# Patient Record
Sex: Male | Born: 1950 | Race: White | Hispanic: No | Marital: Single | Smoking: Never smoker
Health system: Southern US, Community
[De-identification: ages and names within clinical notes are randomized; demographics above are authoritative.]

## PROBLEM LIST (undated history)

## (undated) DIAGNOSIS — K219 Gastro-esophageal reflux disease without esophagitis: Secondary | ICD-10-CM

## (undated) DIAGNOSIS — M1990 Unspecified osteoarthritis, unspecified site: Secondary | ICD-10-CM

## (undated) DIAGNOSIS — K5900 Constipation, unspecified: Secondary | ICD-10-CM

## (undated) DIAGNOSIS — Z5189 Encounter for other specified aftercare: Secondary | ICD-10-CM

## (undated) DIAGNOSIS — IMO0002 Reserved for concepts with insufficient information to code with codable children: Secondary | ICD-10-CM

## (undated) DIAGNOSIS — T7840XA Allergy, unspecified, initial encounter: Secondary | ICD-10-CM

## (undated) DIAGNOSIS — J45909 Unspecified asthma, uncomplicated: Secondary | ICD-10-CM

## (undated) DIAGNOSIS — I10 Essential (primary) hypertension: Secondary | ICD-10-CM

## (undated) DIAGNOSIS — Z9109 Other allergy status, other than to drugs and biological substances: Secondary | ICD-10-CM

## (undated) HISTORY — DX: Other allergy status, other than to drugs and biological substances: Z91.09

## (undated) HISTORY — DX: Gastro-esophageal reflux disease without esophagitis: K21.9

## (undated) HISTORY — DX: Unspecified osteoarthritis, unspecified site: M19.90

## (undated) HISTORY — DX: Constipation, unspecified: K59.00

## (undated) HISTORY — DX: Allergy, unspecified, initial encounter: T78.40XA

## (undated) HISTORY — DX: Encounter for other specified aftercare: Z51.89

## (undated) HISTORY — DX: Essential (primary) hypertension: I10

## (undated) HISTORY — DX: Unspecified asthma, uncomplicated: J45.909

## (undated) HISTORY — PX: CATARACT EXTRACTION: SUR2

## (undated) HISTORY — DX: Reserved for concepts with insufficient information to code with codable children: IMO0002

## (undated) HISTORY — PX: POLYPECTOMY: SHX149

## (undated) HISTORY — PX: COLONOSCOPY: SHX174

---

## 1997-11-22 ENCOUNTER — Other Ambulatory Visit: Admission: RE | Admit: 1997-11-22 | Discharge: 1997-11-22 | Payer: Self-pay | Admitting: Dermatology

## 2001-04-19 ENCOUNTER — Ambulatory Visit (HOSPITAL_COMMUNITY): Admission: RE | Admit: 2001-04-19 | Discharge: 2001-04-19 | Payer: Self-pay | Admitting: Gastroenterology

## 2006-01-13 ENCOUNTER — Ambulatory Visit: Payer: Self-pay | Admitting: Internal Medicine

## 2006-02-02 ENCOUNTER — Encounter: Payer: Self-pay | Admitting: Internal Medicine

## 2006-02-02 ENCOUNTER — Ambulatory Visit: Payer: Self-pay | Admitting: Internal Medicine

## 2007-12-29 ENCOUNTER — Ambulatory Visit: Payer: Self-pay | Admitting: Cardiology

## 2008-01-02 ENCOUNTER — Ambulatory Visit: Payer: Self-pay

## 2010-10-27 NOTE — Assessment & Plan Note (Signed)
[facility]                            CARDIOLOGY OFFICE NOTE   NAME:Steven Moreno, Steven Moreno                        MRN:          [id number]  DATE:12/29/2007                            DOB:          04/07/1951    The patient is a very pleasant 60 year old gentleman who I am asked to  evaluate for chest pain.  He has no prior cardiac history.  He typically  does not have exertional chest pain.  He does have some dyspnea on  exertion, but there is no orthopnea, PND, pedal edema, palpitations,  presyncope, or syncope.  Since May, he has been having pain in his left  upper chest area.  It is described as a sharp sensation.  It does not  radiate.  It is not pleuritic or positional nor is it related to food.  It is not exertional.  It lasts for 10-15 minutes and resolve  spontaneously.  Note, there is no associated nausea, vomiting, shortness  of breath, or diaphoresis.  Also of note, he had had a problem with a  cervical disk problem and had pain in his left upper extremity related  to this that was continuous.  He has proceeded with therapy and this is  improving and he feels this pain in his chest may be similar to what was  in his left shoulder.  Because of the above, we were asked to further  evaluate.   His present medications include Benicar HCT 40/25 mg p.o. daily,  potassium 20 mEq p.o. daily, Nexium, and amlodipine 5 mg p.o. daily.  He  uses Astelin as needed, and Advil as needed.  He has a question history  of an allergy to ASPIRIN.   SOCIAL HISTORY:  He do not smoke and only very rarely consumes alcohol.   His family history is negative for coronary artery disease.  He did  state that his mother had congestive heart failure related to  chemotherapy.   His past medical history is significant for hypertension, but there is  no diabetes mellitus or hyperlipidemia.  He does have a history of  peptic ulcer disease.  He has had psoriatic arthritis.  He has  also had  a cervical disk problem as described in the HPI.  He has had no previous  surgeries.   REVIEW OF SYSTEMS:  He denies any headaches, fevers, or chills.  There  is no productive cough or hemoptysis.  There is no dysphagia,  odynophagia, melena, or hematochezia.  There is no dysuria or hematuria.  There is no rashes or seizure activity.  There is no orthopnea, PND, or  pedal edema.  He does have problems with arthritis.  The remaining  systems are negative.   Physical examination today shows a blood pressure of 133/84 and his  pulse is 82.  He weighs 175 pounds.  He is well developed and well  nourished in no acute distress.  Skin is warm and dry.  He does have  some areas of psoriasis.  He does not appear to be depressed.  There is  no peripheral clubbing.  His back is normal.  His HEENT is normal with  normal eyelids.  His neck is supple with a normal upstroke bilaterally.  No bruits noted.  There is no jugular venous distention and I could not  appreciate thyromegaly.  His chest is clear to auscultation, normal  expansion.  His cardiovascular exam reveals regular rhythm.  Normal S1  and S2.  There are no murmurs, rubs, or gallops noted.  Abdominal exam,  no tenderness, nondistended.  Positive bowel sounds.  No  hepatosplenomegaly.  No mass appreciated.  There is no abdominal bruit.  He has 2+ femoral pulses bilaterally.  No bruits.  Extremities show no  edema.  I can palpate no cords.  He has 2+ posterior tibial pulses  bilaterally.  Neurologic exam is grossly intact.   I do have an electrocardiogram from Dr. Landry Dyke office dated December 18, 2007.  At that time, the patient had a sinus rhythm with a normal axis.  There were no significant ST changes noted.   DIAGNOSES:  1. Atypical chest pain - Steven Moreno symptoms may be related to      musculoskeletal pain.  We will plan to proceed with a stress      Myoview.  If it shows no ischemia, then we will not pursue further       ischemia evaluation.  2. Hypertension - his blood pressure appears to be adequately      controlled and he will follow up with Dr. Tresa Endo concerning this      issue.  3. History of peptic ulcer disease.  4. History of psoriatic arthritis.  5. History of hepatic steatosis.  6. History of Graves disease.   We will see him back on an as-needed basis pending the results of his  Myoview.     Madolyn Frieze Jens Som, MD, [facility]  Electronically Signed    BSC/MedQ  DD: 12/29/2007  DT: 12/30/2007  Job #: [id number]   cc:   Pearson Forster, M.D.

## 2010-10-30 NOTE — Procedures (Signed)
[facility]  Patient:    Steven Moreno, Steven Moreno Visit Number: [id number] MRN: [id number]          Service Type: END Location: ENDO Attending Physician:  Orland Mustard Dictated by:   Llana Aliment. Randa Evens, M.D. Proc. Date: 04/19/01 Admit Date:  04/19/2001   CC:         Pearson Forster, M.D.   Procedure Report  PROCEDURE PERFORMED:  Colonoscopy with coagulation of polyp.  ENDOSCOPIST:  Llana Aliment. Randa Evens, M.D.  MEDICATIONS USED:  Fentanyl 7.5 mcg, Versed 7.5 mg IV.  INSTRUMENT:  Adult Olympus video colonoscope.  INDICATIONS:  Patient with a strong family history of colon polyps ____________ .  The patient had a mother with colon polyps.  DESCRIPTION OF PROCEDURE:  The adult Olympus video colonoscope was used.  A digital exam was performed and the Olympus adult video colonoscope was inserted and advanced under direct visualization.  The prep was excellent and we were able to advance to the cecum without difficulty.  The ileocecal valve and appendiceal orifice were seen.  The scope was withdrawn.  The cecum, ascending colon, hepatic flexure, transverse colon, splenic flexure, descending and sigmoid colon were seen well upon withdrawal.  No significant lesions were seen.  A 0.5 cm polyp was seen in the rectum and was cauterized. Scope withdrawn, patient tolerated the procedure well.  ASSESSMENT:  Rectal polyp cauterized.  PLAN:  Check pathology.  Recommend repeating in three or five years.  Will see back in the office in six months if abnormal liver function tests. Dictated by:   Llana Aliment. Randa Evens, M.D. Attending Physician:  Orland Mustard DD:  04/19/01 TD:  04/20/01 Job: [id number]

## 2010-10-30 NOTE — Assessment & Plan Note (Signed)
[facility]                           GASTROENTEROLOGY OFFICE NOTE   NAME:Steven Moreno, Steven Moreno                        MRN:          [id number]  DATE:01/13/2006                            DOB:          Feb 26, 1951    OFFICE CONSULTATION NOTE:   REFERRING PHYSICIAN:  Pearson Forster, MD   REASON FOR CONSULTATION:  Abdominal pain.   HISTORY:  This is a 60 year old white male with a history of psoriatic  arthritis, hypertension, hepatic steatosis, Grave's disease, and remote  peptic ulcer disease complicated by GI bleeding, who was referred through  the courtesy of Dr. Tresa Endo with regard to abdominal pain.  The patient has  been evaluated on this office on two previous occasions for elevated hepatic  transaminases felt secondary to fatty liver disease.  See those dictations  for details.  His last such visit was in August 2005.  His current history  dates back to May, when the patient was started on baby aspirin.  About a  month later he began to develop some mid to lower abdominal discomfort  reminiscent of prior ulcer disease.  He discontinued the medication and was  evaluated by Dr. Tresa Endo on December 31, 2005.  At that time laboratories were  unremarkable, including a hemoglobin of 15.0, normal amylase, normal lipase,  negative Helicobacter pylori antibody, and normal liver function studies.  Hemoccult cards were provided and returned, though the results are unknown.  He was started on Nexium 40 mg daily.  Several days thereafter on Nexium,  off aspirin, his pain resolved.  He denies nausea, vomiting, heartburn,  dysphagia, change in bowel habits.  He also reports to me the presence of  bright red blood in the stool approximately 2 weeks ago.  He did have a  screening colonoscopy in 2000, none since.  There is a family history of  colon cancer.   PAST MEDICAL HISTORY:  As above.   PAST SURGICAL HISTORY:  None.   ALLERGIES:  Intolerant to aspirin and  nonsteroidal anti-inflammatory drugs  (GI bleeding).   CURRENT MEDICATIONS:  1.  Benicar/HCTZ 40/25 mg daily.  2.  Potassium chloride 20 mEq daily.  3.  Nexium 40 mg daily.  4.  Multivitamin p.r.n.   FAMILY HISTORY:  Grandfather with colon cancer.  Father with ulcerative  colitis.   SOCIAL HISTORY:  The patient is single.  He works in Audiological scientist as an  Production designer, theatre/television/film with [employer].  He does not smoke or use alcohol.   PHYSICAL EXAMINATION:  GENERAL:  A well-appearing male in no acute distress.  VITAL SIGNS:  His blood pressure is 130/90, heart rate is 72, weight is 172  pounds.  HEENT:  Sclerae are anicteric.  Conjunctivae are pink.  Oral mucosa intact.  NECK:  No adenopathy.  LUNGS:  Clear.  CARDIAC:  Heart is regular.  ABDOMEN:  Soft without tenderness, mass or hernia.  Good bowel sounds heard.  RECTAL:  Deferred.  EXTREMITIES:  Without edema.   IMPRESSION:  1.  Recent problems with abdominal pain on aspirin, likely due to  nonsteroidal anti-inflammatory drug intolerance or recurrent ulcer      disease.  No evidence of upper gastrointestinal bleeding.  Currently      asymptomatic off aspirin, on Nexium.  2.  Minor rectal bleeding, now resolved.  Rule out benign anorectal      pathology.  Rule out neoplasia.  3.  Family history of colon cancer in his grandfather.  4.  General medical problems as discussed above.   RECOMMENDATIONS:  1.  Schedule colonoscopy to evaluate rectal bleeding and provide neoplasia      screening.  The nature of the procedure as well as the risks, benefits,      and alternatives have been reviewed.  He understood and agreed to      proceed.  2.  Schedule upper endoscopy to evaluate abdominal pain.  3.  Continue Nexium until completion of endoscopy.  Fifteen days of      additional samples have been provided.  4.  Ongoing general medical care with Dr. Tresa Endo.                                   Wilhemina Bonito. Eda Keys., MD   JNP/MedQ  DD:   01/13/2006  DT:  01/13/2006  Job #:  [id number]   cc:   Pearson Forster, MD

## 2011-02-19 ENCOUNTER — Encounter: Payer: Self-pay | Admitting: Internal Medicine

## 2011-03-17 ENCOUNTER — Encounter: Payer: Self-pay | Admitting: Internal Medicine

## 2011-03-26 ENCOUNTER — Encounter: Payer: Self-pay | Admitting: Internal Medicine

## 2011-03-26 ENCOUNTER — Ambulatory Visit (AMBULATORY_SURGERY_CENTER): Payer: BC Managed Care – PPO | Admitting: *Deleted

## 2011-03-26 VITALS — Ht 66.0 in | Wt 176.4 lb

## 2011-03-26 DIAGNOSIS — Z1211 Encounter for screening for malignant neoplasm of colon: Secondary | ICD-10-CM

## 2011-03-26 MED ORDER — PEG-KCL-NACL-NASULF-NA ASC-C 100 G PO SOLR: ORAL | Status: DC

## 2011-04-09 ENCOUNTER — Other Ambulatory Visit: Payer: Self-pay | Admitting: Internal Medicine

## 2011-04-12 ENCOUNTER — Ambulatory Visit (AMBULATORY_SURGERY_CENTER): Payer: BC Managed Care – PPO | Admitting: Internal Medicine

## 2011-04-12 ENCOUNTER — Encounter: Payer: Self-pay | Admitting: Internal Medicine

## 2011-04-12 VITALS — BP 139/87 | HR 69 | Temp 97.4°F | Resp 18 | Ht 66.0 in | Wt 176.0 lb

## 2011-04-12 DIAGNOSIS — D126 Benign neoplasm of colon, unspecified: Secondary | ICD-10-CM

## 2011-04-12 DIAGNOSIS — Z1211 Encounter for screening for malignant neoplasm of colon: Secondary | ICD-10-CM

## 2011-04-12 DIAGNOSIS — Z86010 Personal history of colonic polyps: Secondary | ICD-10-CM

## 2011-04-12 MED ORDER — SODIUM CHLORIDE 0.9 % IV SOLN: 500.0000 mL | INTRAVENOUS | Status: DC

## 2011-04-12 NOTE — Patient Instructions (Signed)
Please review discharge instructions (blue and green sheets)  Await pathology  Resume normal medications  Please read information about polyps

## 2011-04-12 NOTE — Progress Notes (Signed)
Pressure applied to the abdomen to reach cecum. 

## 2011-04-12 NOTE — Progress Notes (Signed)
Pt up to BR to attempt to pass air, unable to.  Ambulated around nurse's station twice and back to BR.  Levsin 0.125mg  SL 2 tabs given.  Pt rates abd discomfort as a "2."  Rectal tube offered which pt refuses.  Warm coffee given.  Pt given option to stay at [facility] until air passes but he states he would rather go home, move around and try to get the air out.

## 2011-04-13 ENCOUNTER — Telehealth: Payer: Self-pay | Admitting: *Deleted

## 2011-04-13 NOTE — Telephone Encounter (Signed)

## 2014-07-22 ENCOUNTER — Telehealth: Payer: Self-pay | Admitting: Internal Medicine

## 2014-07-22 NOTE — Telephone Encounter (Signed)
Spoke with patient and he states he went to Urgent Care last week with diarrhea and abdominal pain. He was given Cipro and Flagyl which he is still taking. The diarrhea stopped. He is still having upper abdominal pain. He is concerned because he is still hurting. Scheduled with Tye Savoy, NP on 07/23/14 at 3:00 PM.

## 2014-07-23 ENCOUNTER — Ambulatory Visit (INDEPENDENT_AMBULATORY_CARE_PROVIDER_SITE_OTHER)
Admission: RE | Admit: 2014-07-23 | Discharge: 2014-07-23 | Disposition: A | Discharge status: Home / Self Care | Payer: BLUE CROSS/BLUE SHIELD | Source: Ambulatory Visit | Attending: Nurse Practitioner | Admitting: Nurse Practitioner

## 2014-07-23 ENCOUNTER — Ambulatory Visit (INDEPENDENT_AMBULATORY_CARE_PROVIDER_SITE_OTHER): Payer: BLUE CROSS/BLUE SHIELD | Admitting: Nurse Practitioner

## 2014-07-23 ENCOUNTER — Encounter: Payer: Self-pay | Admitting: Nurse Practitioner

## 2014-07-23 VITALS — BP 138/78 | HR 66 | Ht 65.35 in | Wt 156.0 lb

## 2014-07-23 DIAGNOSIS — R2991 Unspecified symptoms and signs involving the musculoskeletal system: Secondary | ICD-10-CM

## 2014-07-23 DIAGNOSIS — R937 Abnormal findings on diagnostic imaging of other parts of musculoskeletal system: Secondary | ICD-10-CM

## 2014-07-23 DIAGNOSIS — K529 Noninfective gastroenteritis and colitis, unspecified: Secondary | ICD-10-CM

## 2014-07-23 NOTE — Progress Notes (Signed)
HPI :   Patient is a 64 year old male known to Dr. Henrene Pastor for history of adenomatous colon polyps. His last surveillance colonoscopy was October 2012 with findings of a small descending adenoma.Marland Kitchen He is for recall colonoscopy in 2017  Patient worked in today for evaluation of diarrhea. He was evaluated at urgent care last week for abdominal pain and diarrhea. WBC 4.6. Flagyl and Cipro were prescribed but no antibiotics prior to that. On cipro / flagyl the diarrhea quickly resolved but still having abdominal pain and "tightness". Normal solid BM today.   Patient inquiring about abnormal bulging of left upper abdomen. A year ago patient noticed that left front ribcage was abnormal. The abnormality has not progressed, he has no associated pain.   Past Medical History  Diagnosis Date  . Environmental allergies   . Arthritis   . Cataract     right eye  . GERD (gastroesophageal reflux disease)   . Hypertension   . Ulcer     1985    Family History  Problem Relation Age of Onset  . Colon cancer Maternal Grandfather 52  . Stomach cancer Neg Hx    History  Substance Use Topics  . Smoking status: Never Smoker   . Smokeless tobacco: Never Used  . Alcohol Use: No   Current Outpatient Prescriptions  Medication Sig Dispense Refill  . acetaminophen (TYLENOL) 500 MG tablet Take 500 mg by mouth every 6 (six) hours as needed.      Marland Kitchen amLODipine (NORVASC) 5 MG tablet Take 5 mg by mouth daily.     Marland Kitchen omeprazole (PRILOSEC) 40 MG capsule Take 40 mg by mouth daily.    . ondansetron (ZOFRAN) 4 MG tablet Take 4 mg by mouth every 8 (eight) hours as needed for nausea or vomiting.    . potassium chloride SA (K-DUR,KLOR-CON) 20 MEQ tablet Take 20 mEq by mouth daily.     . tamsulosin (FLOMAX) 0.4 MG CAPS capsule Take 0.4 mg by mouth.    . valsartan-hydrochlorothiazide (DIOVAN-HCT) 320-25 MG per tablet Take 1 tablet by mouth daily.    . chlorpheniramine (CHLOR-TRIMETON) 4 MG tablet Take 4 mg by mouth  daily.      . ciprofloxacin (CIPRO) 500 MG tablet Take 500 mg by mouth 2 (two) times daily.    . diphenoxylate-atropine (LOMOTIL) 2.5-0.025 MG per tablet Take by mouth 4 (four) times daily as needed for diarrhea or loose stools.    . DYMISTA 137-50 MCG/ACT SUSP daily.     Marland Kitchen esomeprazole (NEXIUM) 40 MG capsule Take 40 mg by mouth daily before breakfast.      . fish oil-omega-3 fatty acids 1000 MG capsule Take by mouth daily.      . metroNIDAZOLE (FLAGYL) 500 MG tablet Take 500 mg by mouth 3 (three) times daily.    Marland Kitchen olmesartan-hydrochlorothiazide (BENICAR HCT) 40-25 MG per tablet Take 1 tablet by mouth daily.      Marland Kitchen PATADAY 0.2 % SOLN daily.      No current facility-administered medications for this visit.   Allergies  Allergen Reactions  . Aspirin     Stomach pain, gi bleeding    Review of Systems: All systems reviewed and negative except where noted in HPI.   Physical Exam: BP 138/78 mmHg  Pulse 66  Ht 5' 5.35" (1.66 m)  Wt 156 lb (70.761 kg)  BMI 25.68 kg/m2 Constitutional: Pleasant,well-developed, white male in no acute distress. HEENT: Normocephalic and atraumatic. Conjunctivae are normal. No scleral icterus.  Neck supple.  Cardiovascular: Normal rate, regular rhythm.  Pulmonary/chest: Effort normal and breath sounds normal. No wheezing, rales or rhonchi. Abdominal: Soft, nondistended, nontender. Bowel sounds active throughout. There are no masses palpable. No hepatomegaly. Extremities: no edema Musculoskeletal: lower left anterior rib cage with a large firm, deformed area.  Lymphadenopathy: No cervical adenopathy noted. Neurological: Alert and oriented to person place and time. Skin: Skin is warm and dry. No rashes noted. Psychiatric: Normal mood and affect. Behavior is normal.   ASSESSMENT AND PLAN:  35. 64 year old male with what sounds like a recent bout of gastroenteritis. Urgent Care treated him with cipro / flagyl. Diarrhea, nausea / vomiting resolved. Patient  still has some mid abdominal discomfort but overall this is also much better. Recommended supportive care for now. Patient will call if symptoms recur or if abdominal pain doesn't continue to improve.   2. Abnormal left rib. Large firm deformed area of left anterior rib cage. Patient noticed this a year ago, he is concerned. Reassurance provided. Will obtain some basic xrays and send to PCP if abnormal.

## 2014-07-23 NOTE — Patient Instructions (Addendum)
Your physician has requested that you go to the basement for the following a left side rib x-ray. We will contact you regarding the results.  HE:NIDPOEU Claiborne Billings

## 2014-07-24 DIAGNOSIS — K529 Noninfective gastroenteritis and colitis, unspecified: Secondary | ICD-10-CM | POA: Insufficient documentation

## 2014-07-24 NOTE — Progress Notes (Signed)
Agree with initial assessment and plan as outlined

## 2014-11-29 ENCOUNTER — Encounter: Payer: Self-pay | Admitting: Internal Medicine

## 2016-03-24 ENCOUNTER — Encounter: Payer: Self-pay | Admitting: Internal Medicine

## 2016-04-07 ENCOUNTER — Encounter: Payer: Self-pay | Admitting: Internal Medicine

## 2016-05-25 ENCOUNTER — Ambulatory Visit (AMBULATORY_SURGERY_CENTER): Payer: Self-pay | Admitting: *Deleted

## 2016-05-25 ENCOUNTER — Encounter: Payer: Self-pay | Admitting: Internal Medicine

## 2016-05-25 VITALS — Ht 66.0 in | Wt 172.0 lb

## 2016-05-25 DIAGNOSIS — Z86010 Personal history of colonic polyps: Secondary | ICD-10-CM

## 2016-05-25 MED ORDER — NA SULFATE-K SULFATE-MG SULF 17.5-3.13-1.6 GM/177ML PO SOLN: 1.0000 | Freq: Once | ORAL | 0 refills | Status: AC

## 2016-05-25 NOTE — Progress Notes (Signed)
No egg or soy allergy known to patient  No issues with past sedation with any surgeries  or procedures, no intubation problems  No diet pills per patient No home 02 use per patient  No blood thinners per patient  Pt denies issues with constipation as long as he takes metamucil daily- off this for 5 days before colon - instructed pt to use OTC stool softener and if no help and gets constipated call office  No A fib or A flutter   emmi video to e mail

## 2016-05-26 ENCOUNTER — Telehealth: Payer: Self-pay | Admitting: Internal Medicine

## 2016-05-26 NOTE — Telephone Encounter (Signed)
Forwarded to Julieanne Cotton for free sample.  LMOM for pt to make him aware. Angela/PV

## 2016-05-27 NOTE — Telephone Encounter (Signed)
Will call patient with sample

## 2016-05-28 NOTE — Telephone Encounter (Signed)
Patient calling in regarding this. best # [phone]

## 2016-06-04 ENCOUNTER — Ambulatory Visit (AMBULATORY_SURGERY_CENTER): Payer: Medicare HMO | Admitting: Internal Medicine

## 2016-06-04 ENCOUNTER — Encounter: Payer: Self-pay | Admitting: Internal Medicine

## 2016-06-04 VITALS — BP 110/75 | HR 63 | Temp 98.0°F | Resp 12 | Ht 66.0 in | Wt 172.0 lb

## 2016-06-04 DIAGNOSIS — D120 Benign neoplasm of cecum: Secondary | ICD-10-CM

## 2016-06-04 DIAGNOSIS — Z86010 Personal history of colonic polyps: Secondary | ICD-10-CM

## 2016-06-04 MED ORDER — SODIUM CHLORIDE 0.9 % IV SOLN: 500.0000 mL | INTRAVENOUS | Status: DC

## 2016-06-04 NOTE — Progress Notes (Signed)
Called to room to assist during endoscopic procedure.  Patient ID and intended procedure confirmed with present staff. Received instructions for my participation in the procedure from the performing physician.  

## 2016-06-04 NOTE — Patient Instructions (Signed)
YOU HAD AN ENDOSCOPIC PROCEDURE TODAY AT THE [facility]:   Refer to the procedure report that was given to you for any specific questions about what was found during the examination.  If the procedure report does not answer your questions, please call your gastroenterologist to clarify.  If you requested that your care partner not be given the details of your procedure findings, then the procedure report has been included in a sealed envelope for you to review at your convenience later.  YOU SHOULD EXPECT: Some feelings of bloating in the abdomen. Passage of more gas than usual.  Walking can help get rid of the air that was put into your GI tract during the procedure and reduce the bloating. If you had a lower endoscopy (such as a colonoscopy or flexible sigmoidoscopy) you may notice spotting of blood in your stool or on the toilet paper. If you underwent a bowel prep for your procedure, you may not have a normal bowel movement for a few days.  Please Note:  You might notice some irritation and congestion in your nose or some drainage.  This is from the oxygen used during your procedure.  There is no need for concern and it should clear up in a day or so.  SYMPTOMS TO REPORT IMMEDIATELY:   Following lower endoscopy (colonoscopy or flexible sigmoidoscopy):  Excessive amounts of blood in the stool  Significant tenderness or worsening of abdominal pains  Swelling of the abdomen that is new, acute  Fever of 100F or higher  For urgent or emergent issues, a gastroenterologist can be reached at any hour by calling [phone].   DIET:  We do recommend a small meal at first, but then you may proceed to your regular diet.  Drink plenty of fluids but you should avoid alcoholic beverages for 24 hours. Try to increase the fiber in your diet, and drink plenty of water.  ACTIVITY:  You should plan to take it easy for the rest of today and you should NOT DRIVE or use heavy machinery until  tomorrow (because of the sedation medicines used during the test).    FOLLOW UP: Our staff will call the number listed on your records the next business day following your procedure to check on you and address any questions or concerns that you may have regarding the information given to you following your procedure. If we do not reach you, we will leave a message.  However, if you are feeling well and you are not experiencing any problems, there is no need to return our call.  We will assume that you have returned to your regular daily activities without incident.  If any biopsies were taken you will be contacted by phone or by letter within the next 1-3 weeks.  Please call us at [phone] if you have not heard about the biopsies in 3 weeks.    SIGNATURES/CONFIDENTIALITY: You and/or your care partner have signed paperwork which will be entered into your electronic medical record.  These signatures attest to the fact that that the information above on your After Visit Summary has been reviewed and is understood.  Full responsibility of the confidentiality of this discharge information lies with you and/or your care-partner.  Thank-you for choosing us for your healthcare needs today. 

## 2016-06-04 NOTE — Progress Notes (Signed)
To recovery vss report to rn

## 2016-06-04 NOTE — Op Note (Signed)
[facility] Patient Name: Steven Moreno Procedure Date: 06/04/2016 2:18 PM MRN: [id number] Endoscopist: Docia Chuck. Henrene Pastor , MD Age: 65 Referring MD:  Date of Birth: 12/24/1950 Gender: Male Account #: [id number] Procedure:                Colonoscopy, with cold snare polypectomy x 2 Indications:              High risk colon cancer surveillance: Personal                            history of non-advanced adenoma. Previous                            examinations 2007 and 2012 Medicines:                Monitored Anesthesia Care Procedure:                Pre-Anesthesia Assessment:                           - Prior to the procedure, a History and Physical                            was performed, and patient medications and                            allergies were reviewed. The patient's tolerance of                            previous anesthesia was also reviewed. The risks                            and benefits of the procedure and the sedation                            options and risks were discussed with the patient.                            All questions were answered, and informed consent                            was obtained. Prior Anticoagulants: The patient has                            taken no previous anticoagulant or antiplatelet                            agents. ASA Grade Assessment: II - A patient with                            mild systemic disease. After reviewing the risks                            and benefits, the patient was deemed in  satisfactory condition to undergo the procedure.                           After obtaining informed consent, the colonoscope                            was passed under direct vision. Throughout the                            procedure, the patient's blood pressure, pulse, and                            oxygen saturations were monitored continuously. The                            Model CF-HQ190L  [id number]) scope was introduced                            through the anus and advanced to the the cecum,                            identified by appendiceal orifice and ileocecal                            valve. The ileocecal valve, appendiceal orifice,                            and rectum were photographed. The quality of the                            bowel preparation was adequate. The colonoscopy was                            performed without difficulty. The patient tolerated                            the procedure well. The bowel preparation used was                            SUPREP. Scope In: 2:29:41 PM Scope Out: 2:44:32 PM Scope Withdrawal Time: 0 hours 10 minutes 32 seconds  Total Procedure Duration: 0 hours 14 minutes 51 seconds  Findings:                 Two polyps were found in the cecum. The polyps were                            2 mm in size. These polyps were removed with a cold                            snare. Resection and retrieval were complete.                           Internal hemorrhoids were found during retroflexion.  The exam was otherwise without abnormality on                            direct and retroflexion views. Complications:            No immediate complications. Estimated blood loss:                            None. Estimated Blood Loss:     Estimated blood loss: none. Impression:               - Two 2 mm polyps in the cecum, removed with a cold                            snare. Resected and retrieved.                           - Internal hemorrhoids.                           - The examination was otherwise normal on direct                            and retroflexion views. Recommendation:           - Repeat colonoscopy in 5 years for surveillance.                           - Patient has a contact number available for                            emergencies. The signs and symptoms of potential                             delayed complications were discussed with the                            patient. Return to normal activities tomorrow.                            Written discharge instructions were provided to the                            patient.                           - Resume previous diet.                           - Continue present medications.                           - Await pathology results. Docia Chuck. Henrene Pastor, MD 06/04/2016 2:49:14 PM This report has been signed electronically.

## 2016-06-08 ENCOUNTER — Telehealth: Payer: Self-pay | Admitting: *Deleted

## 2016-06-08 NOTE — Telephone Encounter (Signed)
  Follow up Call-  Call back number 06/04/2016  Post procedure Call Back phone  # [phone]  Permission to leave phone message Yes  Some recent data might be hidden     Patient questions:  Do you have a fever, pain , or abdominal swelling? No. Pain Score  0 *  Have you tolerated food without any problems? Yes.    Have you been able to return to your normal activities? Yes.    Do you have any questions about your discharge instructions: Diet   No. Medications  No. Follow up visit  No.  Do you have questions or concerns about your Care? No.  Actions: * If pain score is 4 or above: No action needed, pain <4.

## 2016-06-16 ENCOUNTER — Encounter: Payer: Self-pay | Admitting: Internal Medicine

## 2016-07-13 IMAGING — CR DG RIBS 2V*L*
2 series · 2 of 2 positions shown · non-contrast
Comparison: None.

CLINICAL DATA: Palpable mass on left anterior chest wall

EXAM:
LEFT RIBS - 2 VIEW

[view not recorded (1 of 2)]
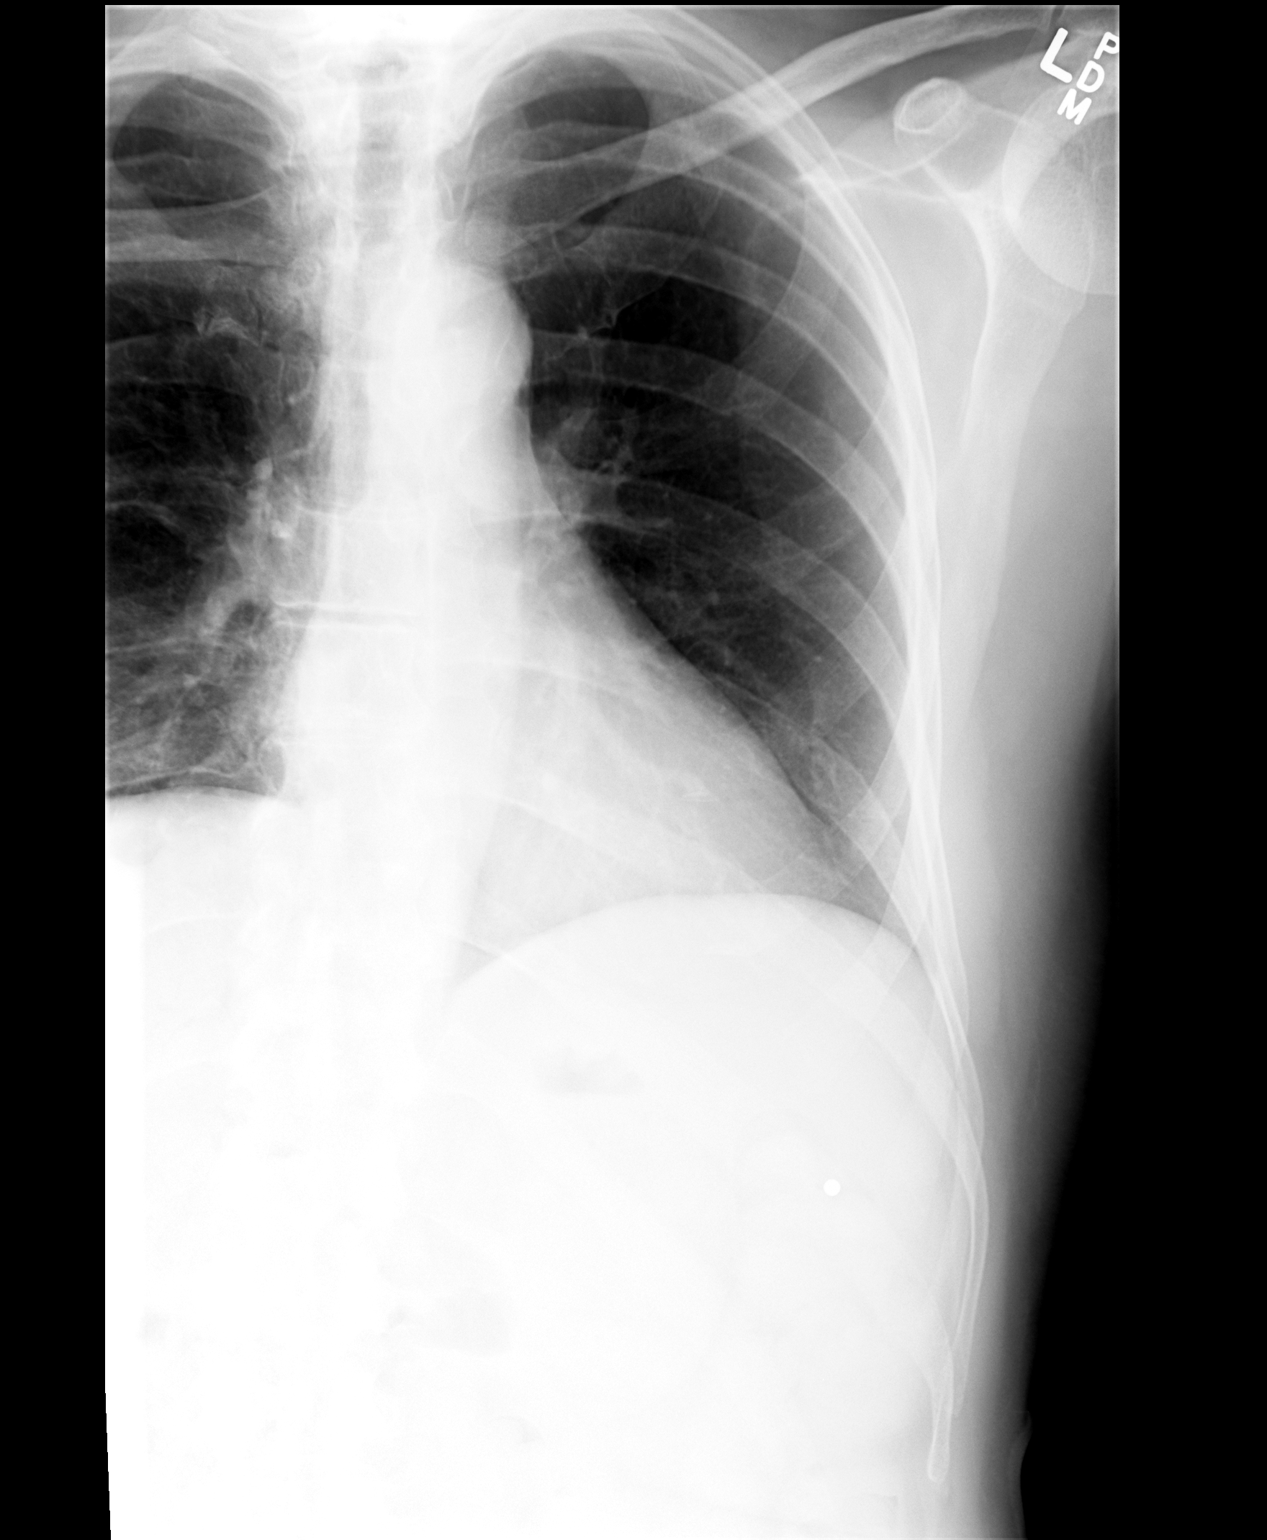

[view not recorded (2 of 2)]
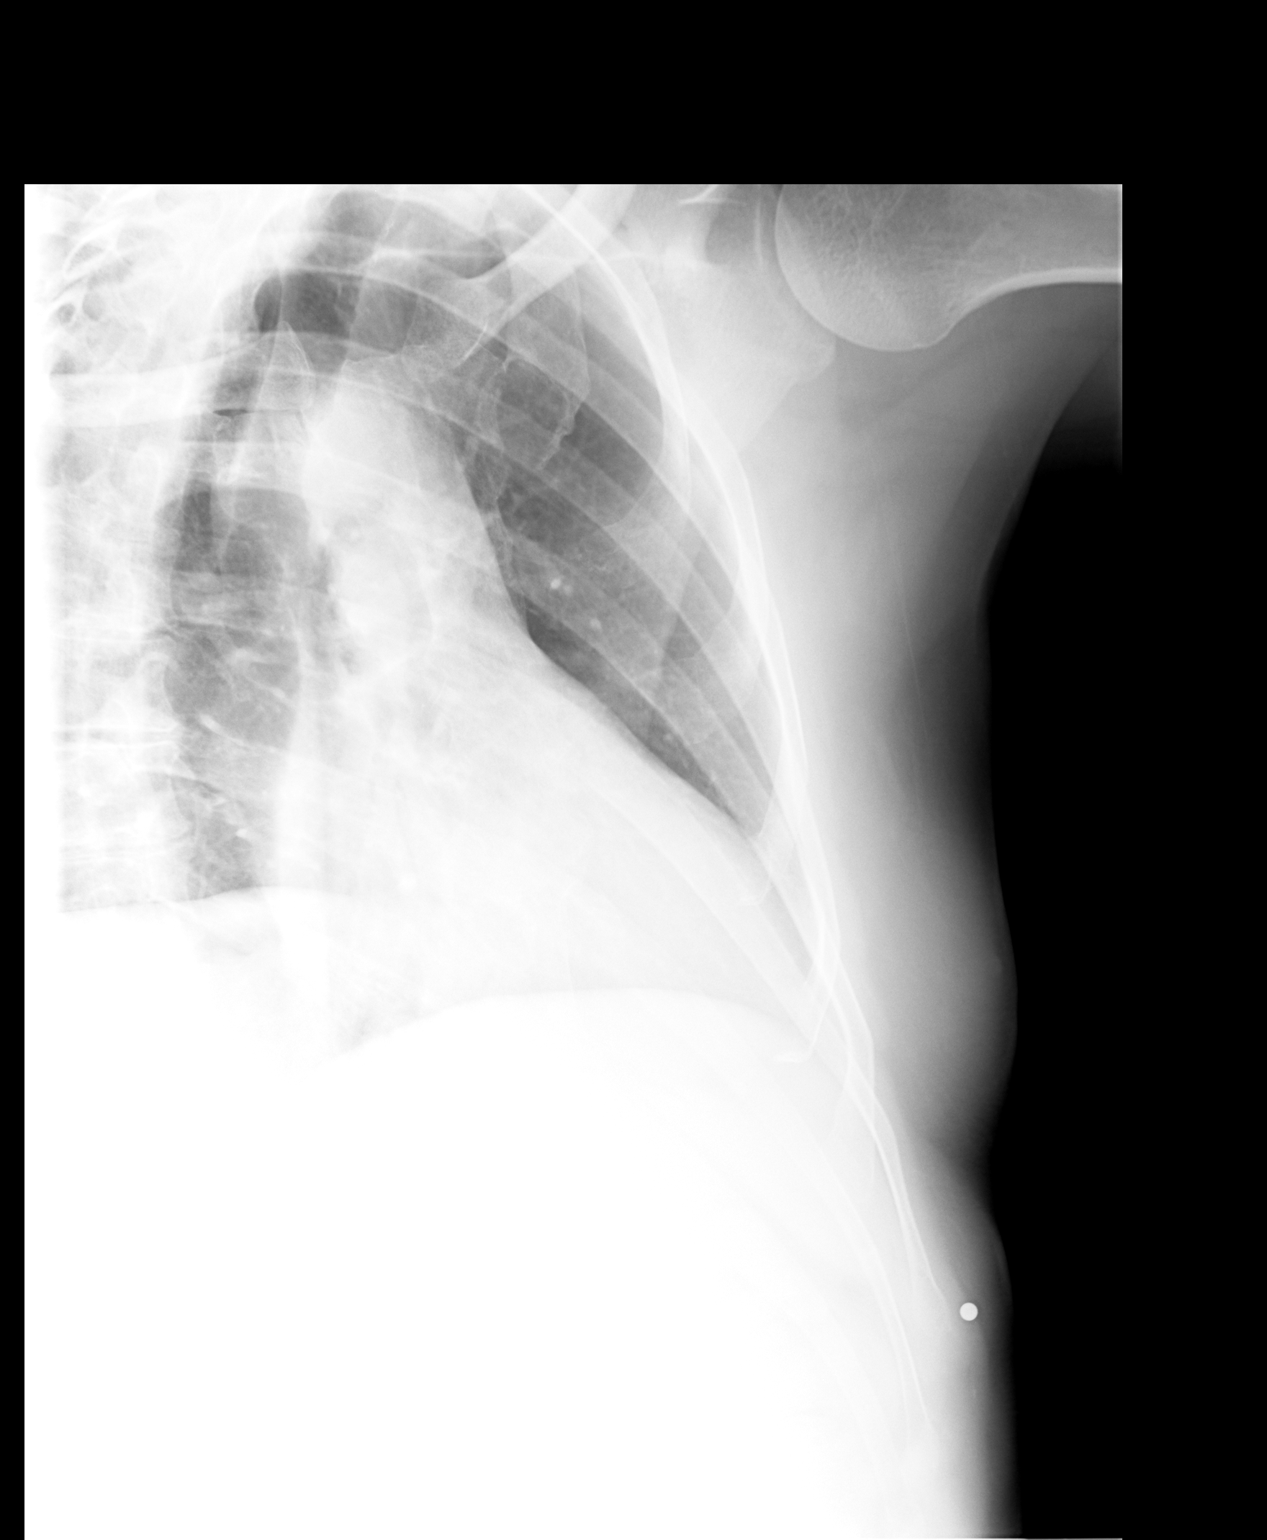

[2 of 2 positions shown; findings below may reference images not displayed]

FINDINGS: No bony abnormality is identified. Given the marking the palpable
area may be related to an anterior rib end. The need for further
evaluation can be determined on a clinical basis.
IMPRESSION: No definitive bony abnormality is noted. The palpable abnormality
may be related to an anterior rib end

## 2018-03-02 ENCOUNTER — Encounter (INDEPENDENT_AMBULATORY_CARE_PROVIDER_SITE_OTHER): Payer: Self-pay

## 2018-03-02 ENCOUNTER — Ambulatory Visit: Payer: Medicare HMO | Admitting: Physician Assistant

## 2018-03-02 ENCOUNTER — Encounter: Payer: Self-pay | Admitting: Physician Assistant

## 2018-03-02 VITALS — BP 130/72 | HR 68 | Ht 64.5 in | Wt 169.2 lb

## 2018-03-02 DIAGNOSIS — R194 Change in bowel habit: Secondary | ICD-10-CM | POA: Diagnosis not present

## 2018-03-02 NOTE — Progress Notes (Signed)
Assessment and plan reviewed 

## 2018-03-02 NOTE — Patient Instructions (Signed)
Your provider suggest that you drink more water. Try to have at least 6-8 8 oz glasses of water daily.   We have given you a high fiber diet handout. Please strive to have 25-30 grams of fiber daily.   Start a daily probiotic such as Electronics engineer.

## 2018-03-02 NOTE — Progress Notes (Signed)
Chief Complaint: Change in bowel habits  HPI:    Steven Moreno is a 67 year old male with a past medical history as listed below, known to Dr. Henrene Pastor, who presents to clinic today for a complaint of change in bowel habits.      06/04/2016 colonoscopy with 2 2 mm polyps in the cecum, internal hemorrhoids and otherwise normal exam.  Repeat was recommended in 5 years to the patient's history of adenomatous polyps.    Today, patient describes that about a month ago he was awoken from his sleep with severe diarrhea, this lasted for a few hours of very watery urgent stool and stomach cramping.  Patient took Imodium and then became constipated.  Tells me that since then he has alternated with a more constipated hard to get out stool which is typically followed by a rush of diarrhea versus loose stool typically occurring only once a day, also goes days with no bowel movement.  Patient tells me prior to all of this he has been constipated for years and was on Metamucil daily but stopped this when he started with diarrhea.  Has occasional abdominal discomfort prior to loose stool.    Denies fever, chills, weight loss, anorexia, nausea, vomiting, blood in his stool, melena or symptoms that now awaken him from his sleep.  Past Medical History:  Diagnosis Date  . Allergy   . Arthritis   . Asthma    as child- out grew this   . Blood transfusion without reported diagnosis    1985 with ulcer  . Cataract    right eye  . Constipation    metamucil helps  . Environmental allergies   . GERD (gastroesophageal reflux disease)   . Hypertension   . Ulcer    1985    Past Surgical History:  Procedure Laterality Date  . CATARACT EXTRACTION Right   . COLONOSCOPY    . POLYPECTOMY      Current Outpatient Medications  Medication Sig Dispense Refill  . amLODipine (NORVASC) 5 MG tablet Take 5 mg by mouth daily.     . chlorpheniramine (CHLOR-TRIMETON) 4 MG tablet Take 4 mg by mouth daily.      .  diphenoxylate-atropine (LOMOTIL) 2.5-0.025 MG per tablet Take by mouth 4 (four) times daily as needed for diarrhea or loose stools.    Marland Kitchen omeprazole (PRILOSEC) 40 MG capsule Take 40 mg by mouth once a week.     . potassium chloride SA (K-DUR,KLOR-CON) 20 MEQ tablet Take 20 mEq by mouth daily.     . pseudoephedrine (SUDAFED) 30 MG tablet Take 30 mg by mouth every 6 (six) hours as needed for congestion.    . tamsulosin (FLOMAX) 0.4 MG CAPS capsule Take 0.4 mg by mouth.    . valsartan-hydrochlorothiazide (DIOVAN-HCT) 320-25 MG per tablet Take 1 tablet by mouth daily.     Current Facility-Administered Medications  Medication Dose Route Frequency Provider Last Rate Last Dose  . 0.9 %  sodium chloride infusion  500 mL Intravenous Continuous Irene Shipper, MD        Allergies as of 03/02/2018 - Review Complete 03/02/2018  Allergen Reaction Noted  . Aspirin  03/26/2011    Family History  Problem Relation Age of Onset  . Colon cancer Maternal Grandfather 33  . Stomach cancer Neg Hx   . Colon polyps Neg Hx   . Rectal cancer Neg Hx     Social History   Socioeconomic History  . Marital status: Single  Spouse name: Not on file  . Number of children: Not on file  . Years of education: Not on file  . Highest education level: Not on file  Occupational History  . Not on file  Social Needs  . Financial resource strain: Not on file  . Food insecurity:    Worry: Not on file    Inability: Not on file  . Transportation needs:    Medical: Not on file    Non-medical: Not on file  Tobacco Use  . Smoking status: Never Smoker  . Smokeless tobacco: Never Used  Substance and Sexual Activity  . Alcohol use: No    Alcohol/week: 0.0 standard drinks  . Drug use: No  . Sexual activity: Not on file  Lifestyle  . Physical activity:    Days per week: Not on file    Minutes per session: Not on file  . Stress: Not on file  Relationships  . Social connections:    Talks on phone: Not on file     Gets together: Not on file    Attends religious service: Not on file    Active member of club or organization: Not on file    Attends meetings of clubs or organizations: Not on file    Relationship status: Not on file  . Intimate partner violence:    Fear of current or ex partner: Not on file    Emotionally abused: Not on file    Physically abused: Not on file    Forced sexual activity: Not on file  Other Topics Concern  . Not on file  Social History Narrative  . Not on file    Review of Systems:    Constitutional: No weight loss, fever or chills Cardiovascular: No chest pain Respiratory: No SOB  Gastrointestinal: See HPI and otherwise negative   Physical Exam:  Vital signs: BP 130/72 (BP Location: Left Arm, Patient Position: Sitting, Cuff Size: Normal)   Pulse 68   Ht 5' 4.5" (1.638 m) Comment: height measured without shoes  Wt 169 lb 4 oz (76.8 kg)   BMI 28.60 kg/m   Constitutional:   Pleasant Caucasian male appears to be in NAD, Well developed, Well nourished, alert and cooperative Respiratory: Respirations even and unlabored. Lungs clear to auscultation bilaterally.   No wheezes, crackles, or rhonchi.  Cardiovascular: Normal S1, S2. No MRG. Regular rate and rhythm. No peripheral edema, cyanosis or pallor.  Gastrointestinal:  Soft, nondistended, nontender. No rebound or guarding. Normal bowel sounds. No appreciable masses or hepatomegaly. Rectal:  Not performed.  Psychiatric: Demonstrates good judgement and reason without abnormal affect or behaviors.  No recent labs or imaging.  Assessment: 1.  Change in bowel habits: History of chronic constipation, turn to a mixture of diarrhea and constipation over the past month, started with a severe episode of diarrhea with abdominal cramping, recent colonoscopy in 2017 with adenomatous polyps and no other abnormality; consider postinfectious IBS versus other  Plan: 1.  Recommend the patient restart his fiber supplement, he  should aim for 25-35 g/day with use of fiber supplement such as Metamucil, Citrucel or Benefiber and through his diet. 2.  Recommend the patient start a daily probiotic such as Align.  He should use this once daily for at least 2 months.  Provided him with coupons. 3.  Patient to continue his increased water intake of at least 6-8 eight ounce glasses of water per day. 4.  Patient to follow in clinic with me in 4-6 weeks or  sooner if necessary.  Ellouise Newer, PA-C [facility] 03/02/2018, 10:30 AM  Cc: Starling Manns, MD

## 2018-03-10 ENCOUNTER — Telehealth: Payer: Self-pay | Admitting: Physician Assistant

## 2018-03-10 NOTE — Telephone Encounter (Signed)
Pt has been experiencing diarrhea for 3 days. He needs advise.

## 2018-03-10 NOTE — Telephone Encounter (Signed)
I have advised the pt to follow the recommendations given to him be Steven Moreno (he states he has not taken fiber because it causes diarrhea)  He will also begin imodium.  He will keep the appt as scheduled.

## 2018-04-06 ENCOUNTER — Ambulatory Visit: Payer: Medicare HMO | Admitting: Physician Assistant

## 2018-04-07 ENCOUNTER — Ambulatory Visit: Payer: Medicare HMO | Admitting: Physician Assistant

## 2019-03-14 ENCOUNTER — Encounter: Payer: Self-pay | Admitting: Sports Medicine

## 2019-03-14 ENCOUNTER — Ambulatory Visit (INDEPENDENT_AMBULATORY_CARE_PROVIDER_SITE_OTHER): Payer: Medicare HMO | Admitting: Sports Medicine

## 2019-03-14 ENCOUNTER — Other Ambulatory Visit: Payer: Self-pay

## 2019-03-14 VITALS — BP 121/75 | HR 83 | Ht 64.5 in | Wt 174.0 lb

## 2019-03-14 DIAGNOSIS — I10 Essential (primary) hypertension: Secondary | ICD-10-CM | POA: Diagnosis not present

## 2019-03-14 DIAGNOSIS — N419 Inflammatory disease of prostate, unspecified: Secondary | ICD-10-CM | POA: Insufficient documentation

## 2019-03-14 DIAGNOSIS — N410 Acute prostatitis: Secondary | ICD-10-CM

## 2019-03-14 DIAGNOSIS — R300 Dysuria: Secondary | ICD-10-CM

## 2019-03-14 DIAGNOSIS — N400 Enlarged prostate without lower urinary tract symptoms: Secondary | ICD-10-CM | POA: Diagnosis not present

## 2019-03-14 DIAGNOSIS — Z0000 Encounter for general adult medical examination without abnormal findings: Secondary | ICD-10-CM

## 2019-03-14 DIAGNOSIS — Z23 Encounter for immunization: Secondary | ICD-10-CM

## 2019-03-14 DIAGNOSIS — H608X1 Other otitis externa, right ear: Secondary | ICD-10-CM | POA: Insufficient documentation

## 2019-03-14 LAB — POCT URINALYSIS DIPSTICK
Bilirubin, UA: NEGATIVE
Glucose, UA: NEGATIVE
Ketones, UA: NEGATIVE
Nitrite, UA: NEGATIVE
Protein, UA: NEGATIVE
Spec Grav, UA: 1.02 (ref 1.010–1.025)
Urobilinogen, UA: 0.2 E.U./dL
pH, UA: 7 (ref 5.0–8.0)

## 2019-03-14 MED ORDER — CIPROFLOXACIN HCL 750 MG PO TABS: 750.0000 mg | ORAL_TABLET | Freq: Two times a day (BID) | ORAL | 0 refills | Status: DC

## 2019-03-14 MED ORDER — FLUOCINOLONE ACETONIDE 0.01 % OT OIL: 5.0000 [drp] | TOPICAL_OIL | Freq: Two times a day (BID) | OTIC | 6 refills | Status: DC | PRN

## 2019-03-14 NOTE — Assessment & Plan Note (Signed)
Continue Flomax, checking PSA

## 2019-03-14 NOTE — Assessment & Plan Note (Signed)
New patient physical today. Up-to-date on colon cancer screening, pneumococcal 23 given today, up-to-date on shingles vaccination. Return in 1 year.

## 2019-03-14 NOTE — Assessment & Plan Note (Signed)
Leukocytes and nitrites on urinalysis. Adding 4 weeks of ciprofloxacin high-dose. Awaiting urine culture.

## 2019-03-14 NOTE — Assessment & Plan Note (Signed)
Controlled, no changes. 

## 2019-03-14 NOTE — Assessment & Plan Note (Signed)
Adding fluocinolone otic oil.

## 2019-03-14 NOTE — Progress Notes (Signed)
Subjective:    CC: New patient visit with the below complaints as noted in HPI:  HPI:  Steven Moreno is a pleasant 68 year old male, he is here to establish care.  Hypertension: Well-controlled.  Preventive measures: Up-to-date on colon cancer screening, Shingrix vaccination, needs flu shot today, pneumococcal vaccine today.  Dysuria: Known BPH, history of prostatitis a decade ago, now with increasing dysuria, urgency, frequency, hesitancy.  No fevers, chills.  Symptoms are moderate, persistent, localized without radiation.  I reviewed the past medical history, family history, social history, surgical history, and allergies today and no changes were needed.  Please see the problem list section below in epic for further details.  Past Medical History: Past Medical History:  Diagnosis Date  . Allergy   . Arthritis   . Asthma    as child- out grew this   . Blood transfusion without reported diagnosis    1985 with ulcer  . Cataract    right eye  . Constipation    metamucil helps  . Environmental allergies   . GERD (gastroesophageal reflux disease)   . Hypertension   . Ulcer    1985   Past Surgical History: Past Surgical History:  Procedure Laterality Date  . CATARACT EXTRACTION Right   . COLONOSCOPY    . POLYPECTOMY     Social History: Social History   Socioeconomic History  . Marital status: Single    Spouse name: Not on file  . Number of children: Not on file  . Years of education: Not on file  . Highest education level: Not on file  Occupational History  . Not on file  Social Needs  . Financial resource strain: Not on file  . Food insecurity    Worry: Not on file    Inability: Not on file  . Transportation needs    Medical: Not on file    Non-medical: Not on file  Tobacco Use  . Smoking status: Never Smoker  . Smokeless tobacco: Never Used  Substance and Sexual Activity  . Alcohol use: No    Alcohol/week: 0.0 standard drinks  . Drug use: No  . Sexual  activity: Not on file  Lifestyle  . Physical activity    Days per week: Not on file    Minutes per session: Not on file  . Stress: Not on file  Relationships  . Social Herbalist on phone: Not on file    Gets together: Not on file    Attends religious service: Not on file    Active member of club or organization: Not on file    Attends meetings of clubs or organizations: Not on file    Relationship status: Not on file  Other Topics Concern  . Not on file  Social History Narrative  . Not on file   Family History: Family History  Problem Relation Age of Onset  . Colon cancer Maternal Grandfather 46  . Stomach cancer Neg Hx   . Colon polyps Neg Hx   . Rectal cancer Neg Hx    Allergies: Allergies  Allergen Reactions  . Aspirin     Stomach pain, gi bleeding   Medications: See med rec.  Review of Systems: No headache, visual changes, nausea, vomiting, diarrhea, constipation, dizziness, abdominal pain, skin rash, fevers, chills, night sweats, swollen lymph nodes, weight loss, chest pain, body aches, joint swelling, muscle aches, shortness of breath, mood changes, visual or auditory hallucinations.  Objective:    General: Well Developed, well nourished,  and in no acute distress.  Neuro: Alert and oriented x3, extra-ocular muscles intact, sensation grossly intact. Cranial nerves II through XII are intact, motor, sensory, and coordinative functions are all intact. HEENT: Normocephalic, atraumatic, pupils equal round reactive to light, neck supple, no masses, no lymphadenopathy, thyroid nonpalpable. Oropharynx, nasopharynx, external ear canals are unremarkable with the exception of a bit of eczematous change in the right external canal. Skin: Warm and dry, no rashes noted.  Cardiac: Regular rate and rhythm, no murmurs rubs or gallops.  Respiratory: Clear to auscultation bilaterally. Not using accessory muscles, speaking in full sentences.  Abdominal: Soft, nontender,  nondistended, positive bowel sounds, no masses, no organomegaly.  Musculoskeletal: Shoulder, elbow, wrist, hip, knee, ankle stable, and with full range of motion.  Urinalysis is positive for leukocytes and nitrites.  Impression and Recommendations:    The patient was counselled, risk factors were discussed, anticipatory guidance given.  Annual physical exam New patient physical today. Up-to-date on colon cancer screening, pneumococcal 23 given today, up-to-date on shingles vaccination. Return in 1 year.  Benign essential hypertension Controlled, no changes.  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  Prostatitis Leukocytes and nitrites on urinalysis. Adding 4 weeks of ciprofloxacin high-dose. Awaiting urine culture.   ___________________________________________ Gwen Her. Dianah Field, M.D., ABFM., CAQSM. Primary Care and Sports Medicine [facility]  Adjunct Professor of [place] [employer] of Medicine

## 2019-03-14 NOTE — Addendum Note (Signed)
Addended by: Beatris Ship L on: 03/14/2019 10:44 AM   Modules accepted: Orders

## 2019-03-16 LAB — URINE CULTURE
MICRO NUMBER:: 939209
Result:: NO GROWTH
SPECIMEN QUALITY:: ADEQUATE

## 2019-04-02 ENCOUNTER — Ambulatory Visit (INDEPENDENT_AMBULATORY_CARE_PROVIDER_SITE_OTHER): Payer: Medicare HMO | Admitting: Sports Medicine

## 2019-04-02 ENCOUNTER — Encounter: Payer: Self-pay | Admitting: Sports Medicine

## 2019-04-02 ENCOUNTER — Other Ambulatory Visit: Payer: Self-pay

## 2019-04-02 DIAGNOSIS — R21 Rash and other nonspecific skin eruption: Secondary | ICD-10-CM

## 2019-04-02 DIAGNOSIS — L404 Guttate psoriasis: Secondary | ICD-10-CM | POA: Insufficient documentation

## 2019-04-02 MED ORDER — PREDNISONE 50 MG PO TABS: 50.0000 mg | ORAL_TABLET | Freq: Every day | ORAL | 0 refills | Status: DC

## 2019-04-02 MED ORDER — CLOTRIMAZOLE-BETAMETHASONE 1-0.05 % EX CREA: 1.0000 "application " | TOPICAL_CREAM | Freq: Two times a day (BID) | CUTANEOUS | 0 refills | Status: DC

## 2019-04-02 NOTE — Assessment & Plan Note (Addendum)
Maculopapular rash on both legs, left worse than right. Mild edema. Unsure exactly as to what this rash is, it is pruritic suggesting hypersensitivity reaction. Adding 5 days of prednisone, Lotrisone, return to see [place] in 2 weeks for this. We consider a punch biopsy if he has had no improvement. This rash is atypical to be drug-induced.

## 2019-04-02 NOTE — Progress Notes (Signed)
Subjective:    CC: L lower leg rash  HPI: Steven Moreno is a pleasant 68 year old with a history significant for psoriasis and recent tinea pedis who is currently being treated for prostatitis with ciprofloxacin presenting today for a pruritic rash on his L lower leg. The rash is unilateral and presented 3 days ago it has progressively worsened. He has attempted to use prescribed antifungal cream which has not improved the rash. He denies any new environmental exposures. He has concerns that the ras is related to the cipro or a secondary fungal infection.  I reviewed the past medical history, family history, social history, surgical history, and allergies today and no changes were needed.  Please see the problem list section below in epic for further details.  Past Medical History: Past Medical History:  Diagnosis Date  . Allergy   . Arthritis   . Asthma    as child- out grew this   . Blood transfusion without reported diagnosis    1985 with ulcer  . Cataract    right eye  . Constipation    metamucil helps  . Environmental allergies   . GERD (gastroesophageal reflux disease)   . Hypertension   . Ulcer    1985   Past Surgical History: Past Surgical History:  Procedure Laterality Date  . CATARACT EXTRACTION Right   . COLONOSCOPY    . POLYPECTOMY     Social History: Social History   Socioeconomic History  . Marital status: Single    Spouse name: Not on file  . Number of children: Not on file  . Years of education: Not on file  . Highest education level: Not on file  Occupational History  . Not on file  Social Needs  . Financial resource strain: Not on file  . Food insecurity    Worry: Not on file    Inability: Not on file  . Transportation needs    Medical: Not on file    Non-medical: Not on file  Tobacco Use  . Smoking status: Never Smoker  . Smokeless tobacco: Never Used  Substance and Sexual Activity  . Alcohol use: No    Alcohol/week: 0.0 standard drinks  . Drug  use: No  . Sexual activity: Not on file  Lifestyle  . Physical activity    Days per week: Not on file    Minutes per session: Not on file  . Stress: Not on file  Relationships  . Social Herbalist on phone: Not on file    Gets together: Not on file    Attends religious service: Not on file    Active member of club or organization: Not on file    Attends meetings of clubs or organizations: Not on file    Relationship status: Not on file  Other Topics Concern  . Not on file  Social History Narrative  . Not on file   Family History: Family History  Problem Relation Age of Onset  . Colon cancer Maternal Grandfather 34  . Stomach cancer Neg Hx   . Colon polyps Neg Hx   . Rectal cancer Neg Hx    Allergies: Allergies  Allergen Reactions  . Aspirin     Stomach pain, gi bleeding   Medications: See med rec.  Review of Systems: No fevers, chills, night sweats, weight loss, chest pain, or shortness of breath.   Objective:    General: Well Developed, well nourished, and in no acute distress.  Neuro: Alert  and oriented x3, extra-ocular muscles intact, sensation grossly intact.  HEENT: Normocephalic, atraumatic.  Skin: Warm and dry, multiple papules noted over lower legs and elbows. Cardiac: Regular rate and rhythm.  Respiratory: Not using accessory muscles, speaking in full sentences.  L Lower Leg On inspection there are several patches of dry scaling skin with the largest approximatly 3 cm in diameter.  There is pitting edema noted bilaterally and warmth on the L side and it is non-tender to palpation.   A/P: Steven Moreno's recent presentation of a dry scaling rash in the setting of his history of psoriasis and ciprofloxacin the cause is uncertain at this point. It is possibly a reaction to the cipro but the unilateral presentation would be unusual. He does not endorse any changes to his environmental exposures and it is unlikely to be an atopic dermatitis. Because of the  location of his rash, there is low suspicion for a fungal infection. He has been prescribed a 5 day course of prednisone and Lotrisone with instructions to follow up in 2 weeks.  Impression and Recommendations:    Rash Maculopapular rash on both legs, left worse than right. Mild edema. Unsure exactly as to what this rash is, it is pruritic suggesting hypersensitivity reaction. Adding 5 days of prednisone, Lotrisone, return to see [place] in 2 weeks for this. We consider a punch biopsy if he has had no improvement. This rash is atypical to be drug-induced.   ___________________________________________ Gwen Her. Dianah Field, M.D., ABFM., CAQSM. Primary Care and Sports Medicine [facility]  Adjunct Professor of [place] of [facility] of Medicine

## 2019-04-05 LAB — COMPLETE METABOLIC PANEL WITH GFR
AG Ratio: 1.5 (calc) (ref 1.0–2.5)
ALT: 35 U/L (ref 9–46)
AST: 35 U/L (ref 10–35)
Albumin: 4.2 g/dL (ref 3.6–5.1)
Alkaline phosphatase (APISO): 58 U/L (ref 35–144)
BUN: 12 mg/dL (ref 7–25)
CO2: 27 mmol/L (ref 20–32)
Calcium: 9.2 mg/dL (ref 8.6–10.3)
Chloride: 99 mmol/L (ref 98–110)
Creat: 0.9 mg/dL (ref 0.70–1.25)
GFR, Est African American: 101 mL/min/{1.73_m2} (ref >=60)
GFR, Est Non African American: 87 mL/min/{1.73_m2} (ref >=60)
Globulin: 2.8 g/dL (calc) (ref 1.9–3.7)
Glucose, Bld: 118 mg/dL — ABNORMAL HIGH (ref 65–99)
Potassium: 3.5 mmol/L (ref 3.5–5.3)
Sodium: 137 mmol/L (ref 135–146)
Total Bilirubin: 0.7 mg/dL (ref 0.2–1.2)
Total Protein: 7 g/dL (ref 6.1–8.1)

## 2019-04-05 LAB — CBC
HCT: 43 % (ref 38.5–50.0)
Hemoglobin: 14.6 g/dL (ref 13.2–17.1)
MCH: 31.1 pg (ref 27.0–33.0)
MCHC: 34 g/dL (ref 32.0–36.0)
MCV: 91.5 fL (ref 80.0–100.0)
MPV: 11 fL (ref 7.5–12.5)
Platelets: 222 10*3/uL (ref 140–400)
RBC: 4.7 10*6/uL (ref 4.20–5.80)
RDW: 13 % (ref 11.0–15.0)
WBC: 10.3 10*3/uL (ref 3.8–10.8)

## 2019-04-05 LAB — PSA, TOTAL AND FREE
PSA, % Free: 13 % (calc) — ABNORMAL LOW (ref >25)
PSA, Free: 0.5 ng/mL
PSA, Total: 3.8 ng/mL (ref <=4.0)

## 2019-04-05 LAB — LIPID PANEL W/REFLEX DIRECT LDL
Cholesterol: 115 mg/dL (ref <200)
HDL: 51 mg/dL (ref >=40)
LDL Cholesterol (Calc): 55 mg/dL (calc)
Non-HDL Cholesterol (Calc): 64 mg/dL (calc) (ref <130)
Total CHOL/HDL Ratio: 2.3 (calc) (ref <5.0)
Triglycerides: 31 mg/dL (ref <150)

## 2019-04-05 LAB — TSH: TSH: 0.55 mIU/L (ref 0.40–4.50)

## 2019-04-11 ENCOUNTER — Encounter: Payer: Self-pay | Admitting: Sports Medicine

## 2019-04-11 ENCOUNTER — Ambulatory Visit (INDEPENDENT_AMBULATORY_CARE_PROVIDER_SITE_OTHER): Payer: Medicare HMO | Admitting: Sports Medicine

## 2019-04-11 ENCOUNTER — Other Ambulatory Visit: Payer: Self-pay

## 2019-04-11 VITALS — BP 112/69 | HR 73 | Ht 64.5 in | Wt 174.0 lb

## 2019-04-11 DIAGNOSIS — R351 Nocturia: Secondary | ICD-10-CM | POA: Diagnosis not present

## 2019-04-11 DIAGNOSIS — N400 Enlarged prostate without lower urinary tract symptoms: Secondary | ICD-10-CM | POA: Diagnosis not present

## 2019-04-11 DIAGNOSIS — R21 Rash and other nonspecific skin eruption: Secondary | ICD-10-CM | POA: Diagnosis not present

## 2019-04-11 DIAGNOSIS — N410 Acute prostatitis: Secondary | ICD-10-CM

## 2019-04-11 LAB — POCT URINALYSIS DIPSTICK
Bilirubin, UA: NEGATIVE
Blood, UA: NEGATIVE
Glucose, UA: NEGATIVE
Ketones, UA: NEGATIVE
Leukocytes, UA: NEGATIVE
Nitrite, UA: NEGATIVE
Protein, UA: NEGATIVE
Spec Grav, UA: 1.02 (ref 1.010–1.025)
Urobilinogen, UA: 0.2 E.U./dL
pH, UA: 7.5 (ref 5.0–8.0)

## 2019-04-11 NOTE — Progress Notes (Signed)
Subjective:    CC: Follow-up  HPI: Prostatitis: Resolved.  I reviewed the past medical history, family history, social history, surgical history, and allergies today and no changes were needed.  Please see the problem list section below in epic for further details.  Past Medical History: Past Medical History:  Diagnosis Date  . Allergy   . Arthritis   . Asthma    as child- out grew this   . Blood transfusion without reported diagnosis    1985 with ulcer  . Cataract    right eye  . Constipation    metamucil helps  . Environmental allergies   . GERD (gastroesophageal reflux disease)   . Hypertension   . Ulcer    1985   Past Surgical History: Past Surgical History:  Procedure Laterality Date  . CATARACT EXTRACTION Right   . COLONOSCOPY    . POLYPECTOMY     Social History: Social History   Socioeconomic History  . Marital status: Single    Spouse name: Not on file  . Number of children: Not on file  . Years of education: Not on file  . Highest education level: Not on file  Occupational History  . Not on file  Social Needs  . Financial resource strain: Not on file  . Food insecurity    Worry: Not on file    Inability: Not on file  . Transportation needs    Medical: Not on file    Non-medical: Not on file  Tobacco Use  . Smoking status: Never Smoker  . Smokeless tobacco: Never Used  Substance and Sexual Activity  . Alcohol use: No    Alcohol/week: 0.0 standard drinks  . Drug use: No  . Sexual activity: Not on file  Lifestyle  . Physical activity    Days per week: Not on file    Minutes per session: Not on file  . Stress: Not on file  Relationships  . Social Herbalist on phone: Not on file    Gets together: Not on file    Attends religious service: Not on file    Active member of club or organization: Not on file    Attends meetings of clubs or organizations: Not on file    Relationship status: Not on file  Other Topics Concern  . Not  on file  Social History Narrative  . Not on file   Family History: Family History  Problem Relation Age of Onset  . Colon cancer Maternal Grandfather 65  . Stomach cancer Neg Hx   . Colon polyps Neg Hx   . Rectal cancer Neg Hx    Allergies: Allergies  Allergen Reactions  . Aspirin     Stomach pain, gi bleeding   Medications: See med rec.  Review of Systems: No fevers, chills, night sweats, weight loss, chest pain, or shortness of breath.   Objective:    General: Well Developed, well nourished, and in no acute distress.  Neuro: Alert and oriented x3, extra-ocular muscles intact, sensation grossly intact.  HEENT: Normocephalic, atraumatic, pupils equal round reactive to light, neck supple, no masses, no lymphadenopathy, thyroid nonpalpable.  Skin: Warm and dry, there were 2 subcentimeter circular reddish lesions on his leg, but much better than before. Cardiac: Regular rate and rhythm, no murmurs rubs or gallops, no lower extremity edema.  Respiratory: Clear to auscultation bilaterally. Not using accessory muscles, speaking in full sentences.  Impression and Recommendations:    BPH (benign prostatic hyperplasia) Continues  with 2 tabs of Flomax daily, doing well, 2 episodes of nocturia which is good for him.  Prostatitis Seemingly resolved with Cipro, continue Flomax at 2 tabs daily.  Rash For the most part resolved.   ___________________________________________ Gwen Her. Dianah Field, M.D., ABFM., CAQSM. Primary Care and Sports Medicine [facility]  Adjunct Professor of [place] of [facility] of Medicine

## 2019-04-11 NOTE — Assessment & Plan Note (Signed)
Seemingly resolved with Cipro, continue Flomax at 2 tabs daily.

## 2019-04-11 NOTE — Assessment & Plan Note (Signed)
Continues with 2 tabs of Flomax daily, doing well, 2 episodes of nocturia which is good for him.

## 2019-04-11 NOTE — Assessment & Plan Note (Signed)
For the most part resolved. 

## 2019-06-05 ENCOUNTER — Other Ambulatory Visit: Payer: Self-pay | Admitting: *Deleted

## 2019-06-05 ENCOUNTER — Other Ambulatory Visit: Payer: Self-pay | Admitting: Sports Medicine

## 2019-06-05 MED ORDER — POTASSIUM CHLORIDE CRYS ER 20 MEQ PO TBCR: 20.0000 meq | EXTENDED_RELEASE_TABLET | Freq: Every day | ORAL | 1 refills | Status: DC

## 2019-06-05 MED ORDER — VALSARTAN-HYDROCHLOROTHIAZIDE 320-25 MG PO TABS: 1.0000 | ORAL_TABLET | Freq: Every day | ORAL | 1 refills | Status: DC

## 2019-06-05 MED ORDER — AMLODIPINE BESYLATE 5 MG PO TABS: 5.0000 mg | ORAL_TABLET | Freq: Every day | ORAL | 0 refills | Status: DC

## 2019-06-05 MED ORDER — AMLODIPINE BESYLATE 5 MG PO TABS: 5.0000 mg | ORAL_TABLET | Freq: Every day | ORAL | 1 refills | Status: DC

## 2019-06-14 ENCOUNTER — Encounter: Payer: Self-pay | Admitting: Physician Assistant

## 2019-06-14 ENCOUNTER — Ambulatory Visit (INDEPENDENT_AMBULATORY_CARE_PROVIDER_SITE_OTHER): Payer: Medicare HMO | Admitting: Physician Assistant

## 2019-06-14 ENCOUNTER — Other Ambulatory Visit: Payer: Self-pay | Admitting: *Deleted

## 2019-06-14 VITALS — Temp 98.0°F | Ht 64.5 in | Wt 174.0 lb

## 2019-06-14 DIAGNOSIS — Z20828 Contact with and (suspected) exposure to other viral communicable diseases: Secondary | ICD-10-CM

## 2019-06-14 DIAGNOSIS — Z20822 Contact with and (suspected) exposure to covid-19: Secondary | ICD-10-CM

## 2019-06-14 MED ORDER — POTASSIUM CHLORIDE CRYS ER 20 MEQ PO TBCR: 20.0000 meq | EXTENDED_RELEASE_TABLET | Freq: Two times a day (BID) | ORAL | 1 refills | Status: DC

## 2019-06-14 NOTE — Progress Notes (Signed)
Patient ID: Steven Moreno, male   DOB: 11-12-1950, 68 y.o.   MRN: [id number] .Marland KitchenVirtual Visit via Video Note  I connected with Steven Moreno on 06/14/19 at  3:00 PM EST by a video enabled telemedicine application and verified that I am speaking with the correct person using two identifiers.  Location: Patient: home Provider: clinic   I discussed the limitations of evaluation and management by telemedicine and the availability of in person appointments. The patient expressed understanding and agreed to proceed.  History of Present Illness: Pt is a 68 yo male who calls in to the clinic to discuss direct exposure. He was exposed to his niece christmas eve who tested positive 3 days later. He did not hug or kiss her. He sat by her on the couch at one point. He is asymptomatic. He wonders about quarantine.   .. Active Ambulatory Problems    Diagnosis Date Noted  . Benign essential hypertension 03/14/2019  . BPH (benign prostatic hyperplasia) 03/14/2019  . Annual physical exam 03/14/2019  . Prostatitis 03/14/2019  . 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 Rash 04/02/2019   Resolved Ambulatory Problems    Diagnosis Date Noted  . Abnormal findings on examination of musculoskeletal system 07/23/2014  . Gastroenteritis, acute 07/24/2014   Past Medical History:  Diagnosis Date  . Allergy   . Arthritis   . Asthma   . Blood transfusion without reported diagnosis   . Cataract   . Constipation   . Environmental allergies   . GERD (gastroesophageal reflux disease)   . Hypertension   . Ulcer    Reviewed med, allergies, problem list.     Observations/Objective: No acute distress. Normal mood and appearance.   .. Today's Vitals   06/14/19 1345  Temp: 98 F (36.7 C)  TempSrc: Oral  Weight: 174 lb (78.9 kg)  Height: 5' 4.5" (1.638 m)   Body mass index is 29.41 kg/m.    Assessment and Plan: Marland KitchenMarland KitchenHerminio was seen today for advice only.  Diagnoses and all  orders for this visit:  Close exposure to COVID-19 virus   Pt is asymptomatic ok to self isolate for 14 days from exposure. Ok to go back to work on Jan 8th. If patient becomes symptomatic go get tested to quarantine 10 days from his symptoms. Encouraged vitamin C and zinc.    Follow Up Instructions:    I discussed the assessment and treatment plan with the patient. The patient was provided an opportunity to ask questions and all were answered. The patient agreed with the plan and demonstrated an understanding of the instructions.   The patient was advised to call back or seek an in-person evaluation if the symptoms worsen or if the condition fails to improve as anticipated.   Iran Planas, PA-C

## 2019-06-14 NOTE — Progress Notes (Signed)
Exposed to Covid 06/07/2019 No symptoms Just wants some advise - employer may want him to get tested

## 2019-06-18 ENCOUNTER — Other Ambulatory Visit: Payer: Medicare HMO

## 2019-06-19 ENCOUNTER — Other Ambulatory Visit: Payer: Self-pay | Admitting: Sports Medicine

## 2019-06-19 DIAGNOSIS — R21 Rash and other nonspecific skin eruption: Secondary | ICD-10-CM

## 2019-09-13 ENCOUNTER — Encounter: Payer: Self-pay | Admitting: Sports Medicine

## 2019-09-13 ENCOUNTER — Ambulatory Visit (INDEPENDENT_AMBULATORY_CARE_PROVIDER_SITE_OTHER): Payer: Medicare HMO | Admitting: Sports Medicine

## 2019-09-13 ENCOUNTER — Other Ambulatory Visit: Payer: Self-pay

## 2019-09-13 DIAGNOSIS — I878 Other specified disorders of veins: Secondary | ICD-10-CM

## 2019-09-13 DIAGNOSIS — M48061 Spinal stenosis, lumbar region without neurogenic claudication: Secondary | ICD-10-CM | POA: Diagnosis not present

## 2019-09-13 MED ORDER — MAGNESIUM OXIDE 400 MG PO TABS: 800.0000 mg | ORAL_TABLET | Freq: Every day | ORAL | 3 refills | Status: DC

## 2019-09-13 NOTE — Patient Instructions (Signed)

## 2019-09-13 NOTE — Assessment & Plan Note (Signed)
Steven Moreno has had chronic swelling in his lower extremities, very mild not really bothersome, on exam he has hemosiderin deposits on his legs and 1+ pitting edema bilaterally and symmetric with negative Homans signs bilaterally. Good dorsalis pedis and posterior tibial arterial pulses. I explained the pathophysiology of venous stasis, we offered lower extremity compression hose, he is going to think about it and if desired he can get it from the pharmacy. Return as needed for this, no further intervention needed.

## 2019-09-13 NOTE — Progress Notes (Signed)
    Procedures performed today:    None.  Independent interpretation of notes and tests performed by another provider:   None.  Brief History, Exam, Impression, and Recommendations:    Chronic venous stasis Steven Moreno has had chronic swelling in his lower extremities, very mild not really bothersome, on exam he has hemosiderin deposits on his legs and 1+ pitting edema bilaterally and symmetric with negative Homans signs bilaterally. Good dorsalis pedis and posterior tibial arterial pulses. I explained the pathophysiology of venous stasis, we offered lower extremity compression hose, he is going to think about it and if desired he can get it from the pharmacy. Return as needed for this, no further intervention needed.  Lumbar spinal stenosis Symptoms are overall well controlled with chiropractic manipulation. He does get some tightness at night, likely related to his stenosis in a radicular sense. I am going to add a little bit of magnesium oxide to take at night, certainly if this is ineffective we can consider nocturnal gabapentin.    ___________________________________________ Gwen Her. Dianah Field, M.D., ABFM., CAQSM. Primary Care and [place] Instructor of [place] of [facility] of Medicine

## 2019-09-13 NOTE — Assessment & Plan Note (Signed)
Symptoms are overall well controlled with chiropractic manipulation. He does get some tightness at night, likely related to his stenosis in a radicular sense. I am going to add a little bit of magnesium oxide to take at night, certainly if this is ineffective we can consider nocturnal gabapentin.

## 2019-12-10 ENCOUNTER — Other Ambulatory Visit: Payer: Self-pay | Admitting: Sports Medicine

## 2020-02-06 ENCOUNTER — Other Ambulatory Visit: Payer: Self-pay | Admitting: Sports Medicine

## 2020-02-28 ENCOUNTER — Other Ambulatory Visit: Payer: Self-pay | Admitting: Sports Medicine

## 2020-05-27 ENCOUNTER — Other Ambulatory Visit: Payer: Self-pay | Admitting: Sports Medicine

## 2020-07-16 ENCOUNTER — Ambulatory Visit (INDEPENDENT_AMBULATORY_CARE_PROVIDER_SITE_OTHER): Payer: Medicare HMO | Admitting: Sports Medicine

## 2020-07-16 ENCOUNTER — Encounter: Payer: Self-pay | Admitting: Sports Medicine

## 2020-07-16 ENCOUNTER — Other Ambulatory Visit: Payer: Self-pay

## 2020-07-16 VITALS — BP 154/90 | HR 66 | Ht 64.5 in | Wt 174.0 lb

## 2020-07-16 DIAGNOSIS — L821 Other seborrheic keratosis: Secondary | ICD-10-CM | POA: Diagnosis not present

## 2020-07-16 DIAGNOSIS — R011 Cardiac murmur, unspecified: Secondary | ICD-10-CM | POA: Diagnosis not present

## 2020-07-16 DIAGNOSIS — Z0001 Encounter for general adult medical examination with abnormal findings: Secondary | ICD-10-CM

## 2020-07-16 DIAGNOSIS — Z0000 Encounter for general adult medical examination without abnormal findings: Secondary | ICD-10-CM

## 2020-07-16 DIAGNOSIS — L989 Disorder of the skin and subcutaneous tissue, unspecified: Secondary | ICD-10-CM

## 2020-07-16 DIAGNOSIS — R21 Rash and other nonspecific skin eruption: Secondary | ICD-10-CM | POA: Diagnosis not present

## 2020-07-16 DIAGNOSIS — Z23 Encounter for immunization: Secondary | ICD-10-CM

## 2020-07-16 DIAGNOSIS — N400 Enlarged prostate without lower urinary tract symptoms: Secondary | ICD-10-CM | POA: Diagnosis not present

## 2020-07-16 DIAGNOSIS — K529 Noninfective gastroenteritis and colitis, unspecified: Secondary | ICD-10-CM | POA: Insufficient documentation

## 2020-07-16 DIAGNOSIS — I10 Essential (primary) hypertension: Secondary | ICD-10-CM

## 2020-07-16 MED ORDER — CLOTRIMAZOLE-BETAMETHASONE 1-0.05 % EX CREA: TOPICAL_CREAM | CUTANEOUS | 0 refills | Status: DC

## 2020-07-16 MED ORDER — TETANUS-DIPHTH-ACELL PERTUSSIS 5-2.5-18.5 LF-MCG/0.5 IM SUSY: 0.5000 mL | PREFILLED_SYRINGE | Freq: Once | INTRAMUSCULAR | 0 refills | Status: AC

## 2020-07-16 NOTE — Assessment & Plan Note (Signed)
A little bit better with adding fiber, ultimately I do think he needs a colonoscopy before we can diagnosis as irritable bowel syndrome.

## 2020-07-16 NOTE — Progress Notes (Addendum)
Subjective:    CC: Annual Physical Exam  HPI:  This patient is here for their annual physical  I reviewed the past medical history, family history, social history, surgical history, and allergies today and no changes were needed.  Please see the problem list section below in epic for further details.  Past Medical History: Past Medical History:  Diagnosis Date  . Allergy   . Arthritis   . Asthma    as child- out grew this   . Blood transfusion without reported diagnosis    1985 with ulcer  . Cataract    right eye  . Constipation    metamucil helps  . Environmental allergies   . GERD (gastroesophageal reflux disease)   . Hypertension   . Ulcer    1985   Past Surgical History: Past Surgical History:  Procedure Laterality Date  . CATARACT EXTRACTION Right   . COLONOSCOPY    . POLYPECTOMY     Social History: Social History   Socioeconomic History  . Marital status: Single    Spouse name: Not on file  . Number of children: Not on file  . Years of education: Not on file  . Highest education level: Not on file  Occupational History  . Not on file  Tobacco Use  . Smoking status: Never Smoker  . Smokeless tobacco: Never Used  Substance and Sexual Activity  . Alcohol use: No    Alcohol/week: 0.0 standard drinks  . Drug use: No  . Sexual activity: Not on file  Other Topics Concern  . Not on file  Social History Narrative  . Not on file   Social Determinants of Health   Financial Resource Strain: Not on file  Food Insecurity: Not on file  Transportation Needs: Not on file  Physical Activity: Not on file  Stress: Not on file  Social Connections: Not on file   Family History: Family History  Problem Relation Age of Onset  . Colon cancer Maternal Grandfather 67  . Stomach cancer Neg Hx   . Colon polyps Neg Hx   . Rectal cancer Neg Hx    Allergies: Allergies  Allergen Reactions  . Aspirin     Stomach pain, gi bleeding   Medications: See med  rec.  Review of Systems: No headache, visual changes, nausea, vomiting, diarrhea, constipation, dizziness, abdominal pain, skin rash, fevers, chills, night sweats, swollen lymph nodes, weight loss, chest pain, body aches, joint swelling, muscle aches, shortness of breath, mood changes, visual or auditory hallucinations.  Objective:    General: Well Developed, well nourished, and in no acute distress.  Neuro: Alert and oriented x3, extra-ocular muscles intact, sensation grossly intact. Cranial nerves II through XII are intact, motor, sensory, and coordinative functions are all intact. HEENT: Normocephalic, atraumatic, pupils equal round reactive to light, neck supple, no masses, no lymphadenopathy, thyroid nonpalpable. Oropharynx, nasopharynx, external ear canals are unremarkable. Skin: Warm and dry, seborrheic keratoses noted on the left shoulder posteriorly and the mid back, hyperpigmented lesion located between these 2. Cardiac: Regular rate and rhythm, no murmurs rubs or gallops.  Respiratory: Clear to auscultation bilaterally. Not using accessory muscles, speaking in full sentences.  Abdominal: Soft, nontender, nondistended, positive bowel sounds, no masses, no organomegaly.  Musculoskeletal: Shoulder, elbow, wrist, hip, knee, ankle stable, and with full range of motion.  Procedure:  Cryodestruction of 2 seborrheic keratoses on the back Consent obtained and verified. Time-out conducted. Noted no overlying erythema, induration, or other signs of local infection. Completed without  difficulty using Cryo-Gun. Advised to call if fevers/chills, erythema, induration, drainage, or persistent bleeding.  Impression and Recommendations:    The patient was counselled, risk factors were discussed, anticipatory guidance given.  Annual physical exam Annual physical as above, needs Tdap at his local pharmacy, pneumococcal 13 today. Routine labs ordered.  Rash Overall did well with Lotrisone,  refilling.  Skin lesion of back 3 skin lesions on the upper back, 1 over his left shoulder is a seborrheic keratosis, there is another seborrheic keratosis, irritated in the mid back, these were treated with cryotherapy today. He does have a hyperpigmented irregular appearing nevus between these 2, I would like to bring him back for a punch biopsy.  Systolic murmur Noted systolic murmur in the aortic area, clinically asymptomatic, adding echocardiogram, suspect aortic stenosis.  Chronic diarrhea A little bit better with adding fiber, ultimately I do think he needs a colonoscopy before we can diagnosis as irritable bowel syndrome.  Benign essential hypertension Blood pressure is running elevated today, compliant with medications, he will check at home over the next couple of weeks and let me know how things look and we can augment meds if needed.  BPH (benign prostatic hyperplasia) Symptoms stable, just had his PSA checked with urology in December 2021 and he tells me numbers are looking good, we will cancel his PSA test for today.   ___________________________________________ Gwen Her. Dianah Field, M.D., ABFM., CAQSM. Primary Care and Sports Medicine [place] [facility]  Adjunct Professor of [place] of [facility] of Medicine

## 2020-07-16 NOTE — Assessment & Plan Note (Signed)
Annual physical as above, needs Tdap at his local pharmacy, pneumococcal 13 today. Routine labs ordered.

## 2020-07-16 NOTE — Addendum Note (Signed)
Addended by: Dema Severin on: 07/16/2020 10:57 AM   Modules accepted: Orders

## 2020-07-16 NOTE — Assessment & Plan Note (Signed)
Symptoms stable, just had his PSA checked with urology in December 2021 and he tells me numbers are looking good, we will cancel his PSA test for today.

## 2020-07-16 NOTE — Addendum Note (Signed)
Addended by: Silverio Decamp on: 07/16/2020 10:59 AM   Modules accepted: Orders

## 2020-07-16 NOTE — Assessment & Plan Note (Signed)
Overall did well with Lotrisone, refilling.

## 2020-07-16 NOTE — Assessment & Plan Note (Signed)
3 skin lesions on the upper back, 1 over his left shoulder is a seborrheic keratosis, there is another seborrheic keratosis, irritated in the mid back, these were treated with cryotherapy today. He does have a hyperpigmented irregular appearing nevus between these 2, I would like to bring him back for a punch biopsy.

## 2020-07-16 NOTE — Assessment & Plan Note (Signed)
Noted systolic murmur in the aortic area, clinically asymptomatic, adding echocardiogram, suspect aortic stenosis.

## 2020-07-16 NOTE — Assessment & Plan Note (Signed)
Blood pressure is running elevated today, compliant with medications, he will check at home over the next couple of weeks and let me know how things look and we can augment meds if needed.

## 2020-07-18 ENCOUNTER — Encounter: Payer: Self-pay | Admitting: Internal Medicine

## 2020-07-18 LAB — COMPREHENSIVE METABOLIC PANEL
AG Ratio: 1.6 (calc) (ref 1.0–2.5)
ALT: 28 U/L (ref 9–46)
AST: 27 U/L (ref 10–35)
Albumin: 4.1 g/dL (ref 3.6–5.1)
Alkaline phosphatase (APISO): 62 U/L (ref 35–144)
BUN: 11 mg/dL (ref 7–25)
CO2: 28 mmol/L (ref 20–32)
Calcium: 9.3 mg/dL (ref 8.6–10.3)
Chloride: 101 mmol/L (ref 98–110)
Creat: 0.86 mg/dL (ref 0.70–1.18)
Globulin: 2.6 g/dL (calc) (ref 1.9–3.7)
Glucose, Bld: 103 mg/dL — ABNORMAL HIGH (ref 65–99)
Potassium: 3.7 mmol/L (ref 3.5–5.3)
Sodium: 140 mmol/L (ref 135–146)
Total Bilirubin: 0.8 mg/dL (ref 0.2–1.2)
Total Protein: 6.7 g/dL (ref 6.1–8.1)

## 2020-07-18 LAB — CBC
HCT: 44.5 % (ref 38.5–50.0)
Hemoglobin: 15.8 g/dL (ref 13.2–17.1)
MCH: 33.2 pg — ABNORMAL HIGH (ref 27.0–33.0)
MCHC: 35.5 g/dL (ref 32.0–36.0)
MCV: 93.5 fL (ref 80.0–100.0)
MPV: 11.1 fL (ref 7.5–12.5)
Platelets: 183 10*3/uL (ref 140–400)
RBC: 4.76 10*6/uL (ref 4.20–5.80)
RDW: 12.6 % (ref 11.0–15.0)
WBC: 9 10*3/uL (ref 3.8–10.8)

## 2020-07-18 LAB — LIPID PANEL
Cholesterol: 114 mg/dL (ref <200)
HDL: 48 mg/dL (ref >=40)
LDL Cholesterol (Calc): 55 mg/dL (calc)
Non-HDL Cholesterol (Calc): 66 mg/dL (calc) (ref <130)
Total CHOL/HDL Ratio: 2.4 (calc) (ref <5.0)
Triglycerides: 38 mg/dL (ref <150)

## 2020-07-18 LAB — TSH: TSH: 2.23 mIU/L (ref 0.40–4.50)

## 2020-07-23 ENCOUNTER — Ambulatory Visit (INDEPENDENT_AMBULATORY_CARE_PROVIDER_SITE_OTHER): Payer: Medicare HMO | Admitting: Sports Medicine

## 2020-07-23 ENCOUNTER — Other Ambulatory Visit: Payer: Self-pay | Admitting: Sports Medicine

## 2020-07-23 ENCOUNTER — Other Ambulatory Visit: Payer: Self-pay

## 2020-07-23 VITALS — BP 132/69 | HR 73

## 2020-07-23 DIAGNOSIS — L989 Disorder of the skin and subcutaneous tissue, unspecified: Secondary | ICD-10-CM

## 2020-07-23 DIAGNOSIS — I10 Essential (primary) hypertension: Secondary | ICD-10-CM

## 2020-07-23 DIAGNOSIS — L821 Other seborrheic keratosis: Secondary | ICD-10-CM

## 2020-07-23 NOTE — Addendum Note (Signed)
Addended by: Silverio Decamp on: 07/23/2020 10:16 AM   Modules accepted: Level of Service

## 2020-07-23 NOTE — Assessment & Plan Note (Signed)
Repeat cryotherapy on 2 seborrheic keratoses on the back, I also performed a 6 mm punch biopsy of a suspicious hyperpigmented nevus with primary closure. Return in 10 days for suture removal.

## 2020-07-23 NOTE — Assessment & Plan Note (Signed)
Blood pressure has improved considerably, no change in plan.  I do not really want to drive him much lower than where he is right now at his age.

## 2020-07-23 NOTE — Progress Notes (Addendum)
    Procedures performed today:    Procedure: 6 mm incisional biopsy of suspicious nevus on back Risks, benefits, and alternatives explained and consent obtained. Time out conducted. Surface prepped with alcohol. 2cc lidocaine with epinephine infiltrated in a field block. Adequate anesthesia ensured. Area prepped and draped in a sterile fashion. Excision performed with: Using a 6 mm punch I made a full-thickness biopsy, this was sent off to dermatopathology, I then closed the resultant skin defect with a single 3-0 simple interrupted Ethilon stitch. Hemostasis achieved. Pt stable.  Procedure:  Cryodestruction of 2 seborrheic keratoses on the back Consent obtained and verified. Time-out conducted. Noted no overlying erythema, induration, or other signs of local infection. Completed without difficulty using Cryo-Gun. Advised to call if fevers/chills, erythema, induration, drainage, or persistent bleeding.  Independent interpretation of notes and tests performed by another provider:   None.  Brief History, Exam, Impression, and Recommendations:    Skin lesion of back Repeat cryotherapy on 2 seborrheic keratoses on the back, I also performed a 6 mm punch biopsy of a suspicious hyperpigmented nevus with primary closure. Return in 10 days for suture removal.  Benign essential hypertension Blood pressure has improved considerably, no change in plan.  I do not really want to drive him much lower than where he is right now at his age.    ___________________________________________ Gwen Her. Dianah Field, M.D., ABFM., CAQSM. Primary Care and [place] Instructor of [place] of [facility] of Medicine

## 2020-07-31 ENCOUNTER — Ambulatory Visit: Payer: Medicare HMO | Admitting: Internal Medicine

## 2020-07-31 ENCOUNTER — Encounter: Payer: Self-pay | Admitting: Internal Medicine

## 2020-07-31 ENCOUNTER — Other Ambulatory Visit: Payer: Self-pay

## 2020-07-31 VITALS — BP 152/74 | HR 76 | Ht 65.0 in | Wt 172.0 lb

## 2020-07-31 DIAGNOSIS — R194 Change in bowel habit: Secondary | ICD-10-CM

## 2020-07-31 DIAGNOSIS — K219 Gastro-esophageal reflux disease without esophagitis: Secondary | ICD-10-CM

## 2020-07-31 DIAGNOSIS — Z86010 Personal history of colonic polyps: Secondary | ICD-10-CM

## 2020-07-31 MED ORDER — SUTAB 1479-225-188 MG PO TABS: 1.0000 | ORAL_TABLET | Freq: Once | ORAL | 0 refills | Status: AC

## 2020-07-31 NOTE — Patient Instructions (Signed)
If you are age 71 or older, your body mass index should be between 23-30. Your Body mass index is 28.62 kg/m. If this is out of the aforementioned range listed, please consider follow up with your Primary Care Provider.  If you are age 77 or younger, your body mass index should be between 19-25. Your Body mass index is 28.62 kg/m. If this is out of the aformentioned range listed, please consider follow up with your Primary Care Provider.   You have been scheduled for a colonoscopy. Please follow written instructions given to you at your visit today.  Please pick up your prep supplies at the pharmacy within the next 1-3 days. If you use inhalers (even only as needed), please bring them with you on the day of your procedure.

## 2020-08-01 ENCOUNTER — Other Ambulatory Visit: Payer: Self-pay

## 2020-08-01 ENCOUNTER — Ambulatory Visit (INDEPENDENT_AMBULATORY_CARE_PROVIDER_SITE_OTHER): Payer: Medicare HMO | Admitting: Sports Medicine

## 2020-08-01 DIAGNOSIS — L821 Other seborrheic keratosis: Secondary | ICD-10-CM | POA: Diagnosis not present

## 2020-08-01 NOTE — Progress Notes (Signed)
    Procedures performed today:    Procedure:  Cryodestruction of seborrheic keratosis on the back. Consent obtained and verified. Time-out conducted. Noted no overlying erythema, induration, or other signs of local infection. Completed without difficulty using Cryo-Gun. Advised to call if fevers/chills, erythema, induration, drainage, or persistent bleeding.  Independent interpretation of notes and tests performed by another provider:   None.  Brief History, Exam, Impression, and Recommendations:    Seborrheic keratosis Steven Moreno returns, we performed cryotherapy of 2 seborrheic keratoses on his back as well as an incisional punch biopsy of the third. The incisional punch biopsy showed a simple benign seborrheic keratosis, the caudal seborrheic keratosis has responded well to cryotherapy, the cranial one still needs an additional treatment which was performed today. Return as needed.    ___________________________________________ Gwen Her. Dianah Field, M.D., ABFM., CAQSM. Primary Care and [place] Instructor of [place] of [facility] of Medicine

## 2020-08-01 NOTE — Assessment & Plan Note (Signed)
Steven Moreno returns, we performed cryotherapy of 2 seborrheic keratoses on his back as well as an incisional punch biopsy of the third. The incisional punch biopsy showed a simple benign seborrheic keratosis, the caudal seborrheic keratosis has responded well to cryotherapy, the cranial one still needs an additional treatment which was performed today. Return as needed.

## 2020-08-03 ENCOUNTER — Other Ambulatory Visit: Payer: Self-pay | Admitting: Sports Medicine

## 2020-08-04 ENCOUNTER — Encounter: Payer: Self-pay | Admitting: Internal Medicine

## 2020-08-04 NOTE — Progress Notes (Signed)
HISTORY OF PRESENT ILLNESS:  Steven Moreno is a 70 y.o. male, retired from [employer] and currently [employer], who presents today regarding irregular bowel habits.  He is sent today by his primary care provider regarding this issue.  Patient was last seen in this office September 2018 regarding change in bowel habits.  At that time he reported chronic constipation which changed to constipation alternating with loose bowels.  Fiber recommended.  He did undergo complete colonoscopy December 2017.  Was found to have diminutive adenoma and internal hemorrhoids.  Otherwise normal.  Follow-up in 5 years recommended.  He reports to me that he does not have daily bowel movements.  He has a bowel movement about once every 2 to 3 days.  25% of the time he describes it as loose with occasional urgency.  Other times he describes his bowels as hard.  No bleeding.  He infrequently uses magnesium.  No abdominal pain, weight loss, bleeding.  Does have GERD symptoms twice per week for which he takes on demand acid reducing agents.  No dysphagia.  Does not use alcohol and has not smoked.  He has completed his COVID vaccination series and booster  REVIEW OF SYSTEMS:  All non-GI ROS negative unless otherwise stated in the HPI except for sinus and allergy trouble, arthritis, back pain  Past Medical History:  Diagnosis Date  . Allergy   . Arthritis   . Asthma    as child- out grew this   . Blood transfusion without reported diagnosis    1985 with ulcer  . Cataract    right eye  . Constipation    metamucil helps  . Environmental allergies   . GERD (gastroesophageal reflux disease)   . Hypertension   . Ulcer    1985    Past Surgical History:  Procedure Laterality Date  . CATARACT EXTRACTION Right   . COLONOSCOPY    . POLYPECTOMY      Social History Steven Moreno  reports that he has never smoked. He has never used smokeless tobacco. He reports that he does not drink alcohol and does not use  drugs.  family history includes Colon cancer (age of onset: 23) in his maternal grandfather.  Allergies  Allergen Reactions  . Aspirin     Stomach pain, gi bleeding       PHYSICAL EXAMINATION: Vital signs: BP (!) 152/74   Pulse 76   Ht 5\' 5"  (1.651 m)   Wt 172 lb (78 kg)   BMI 28.62 kg/m   Constitutional: generally well-appearing, no acute distress Psychiatric: alert and oriented x3, cooperative Eyes: extraocular movements intact, anicteric, conjunctiva pink Mouth: oral pharynx moist, no lesions Neck: supple no lymphadenopathy Cardiovascular: heart regular rate and rhythm, no murmur Lungs: clear to auscultation bilaterally Abdomen: soft, nontender, nondistended, no obvious ascites, no peritoneal signs, normal bowel sounds, no organomegaly Rectal: Deferred to colonoscopy Extremities: no clubbing, cyanosis, or lower extremity edema bilaterally Skin: no lesions on visible extremities Neuro: No focal deficits.  Cranial nerves intact  ASSESSMENT:  1.  Alternating bowel habits.  No alarm features 2.  History of adenomatous colon polyps.  Due for surveillance this year 3.  General medical problems.  Stable 4.  GERD without alarm features managed with on-demand acid suppressing agents   PLAN:  1.  Schedule colonoscopy with biopsies to evaluate change in bowel habits and provide up-to-date neoplasia surveillance.The nature of the procedure, as well as the risks, benefits, and alternatives were carefully  and thoroughly reviewed with the patient. Ample time for discussion and questions allowed. The patient understood, was satisfied, and agreed to proceed. 2.  Suspect recommend Daily fortified fiber supplementation to address alternating bowel habits.  This, pending the results of colonoscopy. 3.  Reflux precautions

## 2020-08-13 ENCOUNTER — Other Ambulatory Visit: Payer: Self-pay

## 2020-08-13 ENCOUNTER — Ambulatory Visit (HOSPITAL_BASED_OUTPATIENT_CLINIC_OR_DEPARTMENT_OTHER)
Admission: RE | Admit: 2020-08-13 | Discharge: 2020-08-13 | Disposition: A | Discharge status: Home / Self Care | Payer: Medicare HMO | Source: Ambulatory Visit | Attending: Sports Medicine | Admitting: Sports Medicine

## 2020-08-13 DIAGNOSIS — R011 Cardiac murmur, unspecified: Secondary | ICD-10-CM | POA: Insufficient documentation

## 2020-08-13 LAB — ECHOCARDIOGRAM COMPLETE
Area-P 1/2: 2.93 cm2
P 1/2 time: 686 msec
S' Lateral: 2.38 cm

## 2020-08-23 ENCOUNTER — Other Ambulatory Visit: Payer: Self-pay | Admitting: Sports Medicine

## 2020-10-14 ENCOUNTER — Other Ambulatory Visit: Payer: Self-pay

## 2020-10-14 ENCOUNTER — Encounter: Payer: Self-pay | Admitting: Internal Medicine

## 2020-10-14 ENCOUNTER — Ambulatory Visit (AMBULATORY_SURGERY_CENTER): Payer: Medicare HMO | Admitting: Internal Medicine

## 2020-10-14 VITALS — BP 151/91 | HR 62 | Temp 97.5°F | Resp 17 | Ht 65.0 in | Wt 172.0 lb

## 2020-10-14 DIAGNOSIS — R194 Change in bowel habit: Secondary | ICD-10-CM | POA: Diagnosis present

## 2020-10-14 DIAGNOSIS — D120 Benign neoplasm of cecum: Secondary | ICD-10-CM

## 2020-10-14 DIAGNOSIS — Z86010 Personal history of colonic polyps: Secondary | ICD-10-CM

## 2020-10-14 MED ORDER — SODIUM CHLORIDE 0.9 % IV SOLN: 500.0000 mL | Freq: Once | INTRAVENOUS | Status: AC

## 2020-10-14 NOTE — Progress Notes (Signed)
No problems noted in the recovery room. maw 

## 2020-10-14 NOTE — Patient Instructions (Addendum)
Handout was given to your care partner on polyps. °You may resume your current medications today. °Await biopsy results.  May take 1-3 weeks to receive pathology results. °Repeat colonoscopy in 5 years for surveillance. °Please call if any questions or concerns. °  ° ° ° °YOU HAD AN ENDOSCOPIC PROCEDURE TODAY AT THE [facility]:   Refer to the procedure report that was given to you for any specific questions about what was found during the examination.  If the procedure report does not answer your questions, please call your gastroenterologist to clarify.  If you requested that your care partner not be given the details of your procedure findings, then the procedure report has been included in a sealed envelope for you to review at your convenience later. ° °YOU SHOULD EXPECT: Some feelings of bloating in the abdomen. Passage of more gas than usual.  Walking can help get rid of the air that was put into your GI tract during the procedure and reduce the bloating. If you had a lower endoscopy (such as a colonoscopy or flexible sigmoidoscopy) you may notice spotting of blood in your stool or on the toilet paper. If you underwent a bowel prep for your procedure, you may not have a normal bowel movement for a few days. ° °Please Note:  You might notice some irritation and congestion in your nose or some drainage.  This is from the oxygen used during your procedure.  There is no need for concern and it should clear up in a day or so. ° °SYMPTOMS TO REPORT IMMEDIATELY: ° °Following lower endoscopy (colonoscopy or flexible sigmoidoscopy): ° Excessive amounts of blood in the stool ° Significant tenderness or worsening of abdominal pains ° Swelling of the abdomen that is new, acute ° Fever of 100°F or higher ° ° °For urgent or emergent issues, a gastroenterologist can be reached at any hour by calling [phone]. °Do not use MyChart messaging for urgent concerns.  ° ° °DIET:  We do recommend a small meal at  first, but then you may proceed to your regular diet.  Drink plenty of fluids but you should avoid alcoholic beverages for 24 hours. ° °ACTIVITY:  You should plan to take it easy for the rest of today and you should NOT DRIVE or use heavy machinery until tomorrow (because of the sedation medicines used during the test).   ° °FOLLOW UP: °Our staff will call the number listed on your records 48-72 hours following your procedure to check on you and address any questions or concerns that you may have regarding the information given to you following your procedure. If we do not reach you, we will leave a message.  We will attempt to reach you two times.  During this call, we will ask if you have developed any symptoms of COVID 19. If you develop any symptoms (ie: fever, flu-like symptoms, shortness of breath, cough etc.) before then, please call [phone].  If you test positive for Covid 19 in the 2 weeks post procedure, please call and report this information to us.   ° °If any biopsies were taken you will be contacted by phone or by letter within the next 1-3 weeks.  Please call us at [phone] if you have not heard about the biopsies in 3 weeks.  ° ° °SIGNATURES/CONFIDENTIALITY: °You and/or your care partner have signed paperwork which will be entered into your electronic medical record.  These signatures attest to the fact that that the information   information above on your After Visit Summary has been reviewed and is understood.  Full responsibility of the confidentiality of this discharge information lies with you and/or your care-partner.

## 2020-10-14 NOTE — Progress Notes (Signed)
Report to PACU, RN, vss, BBS= Clear.  

## 2020-10-14 NOTE — Op Note (Signed)
[facility] Patient Name: Steven Moreno Procedure Date: 10/14/2020 9:27 AM MRN: [id number] Endoscopist: Docia Chuck. Henrene Pastor , MD Age: 70 Referring MD:  Date of Birth: 02-08-1951 Gender: Male Account #: [id number] Procedure:                Colonoscopy with cold snare polypectomy x 2 Indications:              High risk colon cancer surveillance: Personal                            history of multiple (3 or more) adenomas,                            Incidental change in bowel habits noted (improved                            since office visit) previous examinations 2003,                            2006, 2012, 2017 Medicines:                Monitored Anesthesia Care Procedure:                Pre-Anesthesia Assessment:                           - Prior to the procedure, a History and Physical                            was performed, and patient medications and                            allergies were reviewed. The patient's tolerance of                            previous anesthesia was also reviewed. The risks                            and benefits of the procedure and the sedation                            options and risks were discussed with the patient.                            All questions were answered, and informed consent                            was obtained. Prior Anticoagulants: The patient has                            taken no previous anticoagulant or antiplatelet                            agents. After reviewing the risks and benefits, the  patient was deemed in satisfactory condition to                            undergo the procedure.                           After obtaining informed consent, the colonoscope                            was passed under direct vision. Throughout the                            procedure, the patient's blood pressure, pulse, and                            oxygen saturations were monitored continuously.  The                            Olympus CF-HQ190 [id number]) Colonoscope was                            introduced through the anus and advanced to the the                            cecum, identified by appendiceal orifice and                            ileocecal valve. The ileocecal valve, appendiceal                            orifice, and rectum were photographed. The quality                            of the bowel preparation was good. The colonoscopy                            was performed without difficulty. The patient                            tolerated the procedure well. The bowel preparation                            used was SUPREP via split dose instruction. Scope In: 9:42:39 AM Scope Out: 10:00:20 AM Scope Withdrawal Time: 0 hours 12 minutes 3 seconds  Total Procedure Duration: 0 hours 17 minutes 41 seconds  Findings:                 Two polyps were found in the cecum. The polyps were                            1 to 2 mm in size. These polyps were removed with a                            cold snare. Resection and  retrieval were complete.                           The exam was otherwise without abnormality on                            direct and retroflexion views. Complications:            No immediate complications. Estimated blood loss:                            None. Estimated Blood Loss:     Estimated blood loss: none. Impression:               - Two 1 to 2 mm polyps in the cecum, removed with a                            cold snare. Resected and retrieved.                           - The examination was otherwise normal on direct                            and retroflexion views. Recommendation:           - Repeat colonoscopy in 5 years for surveillance.                           - Patient has a contact number available for                            emergencies. The signs and symptoms of potential                            delayed complications were discussed  with the                            patient. Return to normal activities tomorrow.                            Written discharge instructions were provided to the                            patient.                           - Resume previous diet.                           - Continue present medications.                           - Await pathology results. Docia Chuck. Henrene Pastor, MD 10/14/2020 10:04:53 AM This report has been signed electronically.

## 2020-10-14 NOTE — Progress Notes (Signed)
Called to room to assist during endoscopic procedure.  Patient ID and intended procedure confirmed with present staff. Received instructions for my participation in the procedure from the performing physician.  

## 2020-10-16 ENCOUNTER — Telehealth: Payer: Self-pay

## 2020-10-16 NOTE — Telephone Encounter (Signed)
  Follow up Call-  Call back number 10/14/2020  Post procedure Call Back phone  # [phone]  Permission to leave phone message Yes  Some recent data might be hidden     Patient questions:  Do you have a fever, pain , or abdominal swelling? No. Pain Score  0 *  Have you tolerated food without any problems? Yes.    Have you been able to return to your normal activities? Yes.    Do you have any questions about your discharge instructions: Diet   No. Medications  No. Follow up visit  No.  Do you have questions or concerns about your Care? No.  Actions: * If pain score is 4 or above: No action needed, pain <4.  1. Have you developed a fever since your procedure? no  2.   Have you had an respiratory symptoms (SOB or cough) since your procedure? no  3.   Have you tested positive for COVID 19 since your procedure no  4.   Have you had any family members/close contacts diagnosed with the COVID 19 since your procedure?  no   If yes to any of these questions please route to Joylene John, RN and Joella Prince, RN

## 2020-10-22 ENCOUNTER — Encounter: Payer: Self-pay | Admitting: Internal Medicine

## 2020-11-07 ENCOUNTER — Other Ambulatory Visit: Payer: Self-pay | Admitting: Sports Medicine

## 2020-12-17 ENCOUNTER — Ambulatory Visit: Payer: Medicare HMO

## 2020-12-18 ENCOUNTER — Ambulatory Visit: Payer: Medicare HMO

## 2020-12-31 ENCOUNTER — Ambulatory Visit (INDEPENDENT_AMBULATORY_CARE_PROVIDER_SITE_OTHER): Payer: Medicare HMO | Admitting: Sports Medicine

## 2020-12-31 ENCOUNTER — Other Ambulatory Visit: Payer: Self-pay

## 2020-12-31 VITALS — BP 143/77 | HR 65 | Ht 65.0 in | Wt 174.1 lb

## 2020-12-31 DIAGNOSIS — Z0000 Encounter for general adult medical examination without abnormal findings: Secondary | ICD-10-CM

## 2020-12-31 NOTE — Patient Instructions (Signed)
  [facility] Maintenance Summary and Written Plan of Care  Steven Moreno ,  Thank you for allowing me to perform your Medicare Annual Wellness Visit and for your ongoing commitment to your health.   Health Maintenance & Immunization History Health Maintenance  Topic Date Due   COVID-19 Vaccine (5 - Booster) 01/16/2021 (Originally 10/10/2020)   INFLUENZA VACCINE  01/12/2021   COLONOSCOPY (Pts 45-11yrs Insurance coverage will need to be confirmed)  10/14/2025   TETANUS/TDAP  10/31/2030   PNA vac Low Risk Adult  Completed   Zoster Vaccines- Shingrix  Completed   HPV VACCINES  Aged Out   Hepatitis C Screening  Discontinued   Immunization History  Administered Date(s) Administered   Fluad Quad(high Dose 65+) 03/14/2019, 04/24/2020   Moderna Sars-Covid-2 Vaccination 04/14/2020, 06/11/2020   PFIZER Comirnaty(Gray Top)Covid-19 Tri-Sucrose Vaccine 08/13/2019, 09/13/2019   Pneumococcal Conjugate-13 07/16/2020   Pneumococcal Polysaccharide-23 03/14/2019   Tdap 10/30/2020   Zoster Recombinat (Shingrix) 05/01/2018, 10/17/2018    These are the patient goals that we discussed:  Goals Addressed               This Visit's Progress     Patient Stated (pt-stated)        12/31/2020 AWV Goal: Exercise for General Health  Patient will verbalize understanding of the benefits of increased physical activity: Exercising regularly is important. It will improve your overall fitness, flexibility, and endurance. Regular exercise also will improve your overall health. It can help you control your weight, reduce stress, and improve your bone density. Over the next year, patient will increase physical activity as tolerated with a goal of at least 150 minutes of moderate physical activity per week.  You can tell that you are exercising at a moderate intensity if your heart starts beating faster and you start breathing faster but can still hold a conversation. Moderate-intensity  exercise ideas include: Walking 1 mile (1.6 km) in about 15 minutes Biking Hiking Golfing Dancing Water aerobics Patient will verbalize understanding of everyday activities that increase physical activity by providing examples like the following: Yard work, such as: Sales promotion account executive Gardening Washing windows or floors Patient will be able to explain general safety guidelines for exercising:  Before you start a new exercise program, talk with your health care provider. Do not exercise so much that you hurt yourself, feel dizzy, or get very short of breath. Wear comfortable clothes and wear shoes with good support. Drink plenty of water while you exercise to prevent dehydration or heat stroke. Work out until your breathing and your heartbeat get faster.          This is a list of Health Maintenance Items that are overdue or due now: There are no preventive care reminders to display for this patient.    Orders/Referrals Placed Today: No orders of the defined types were placed in this encounter.  (Contact our referral department at [phone] if you have not spoken with someone about your referral appointment within the next 5 days)    Follow-up Plan Follow-up with Silverio Decamp, MD as planned Schedule Medicare wellness visit in one year. AVS printed and given to the patient.

## 2020-12-31 NOTE — Progress Notes (Signed)
MEDICARE ANNUAL WELLNESS VISIT  12/31/2020  Subjective:  Steven Moreno is a 70 y.o. male patient of Thekkekandam, Gwen Her, MD who had a Medicare Annual Wellness Visit today. Steven Moreno is Working part time and lives alone. he has 0 children. he reports that he is socially active and does interact with friends/family regularly. he is minimally physically active and enjoys biking.  Patient Care Team: Silverio Decamp, MD as PCP - General (Sports Medicine)  Advanced Directives 12/31/2020 05/25/2016  Does Patient Have a Medical Advance Directive? Yes Yes  Type of Advance Directive Living will;Healthcare Power of [place];Living will  Does patient want to make changes to medical advance directive? No - Patient declined -  Copy of [place] in Chart? No - copy requested [facility] Utilization Over the Past 12 Months: # of hospitalizations or ER visits: 0 # of surgeries: 0  Review of Systems    Patient reports that his overall health is worse when compared to last year.  Review of Systems: History obtained from chart review and the patient  All other systems negative.  Pain Assessment Pain : No/denies pain     Current Medications & Allergies (verified) Allergies as of 12/31/2020       Reactions   Aspirin    Stomach pain, gi bleeding        Medication List        Accurate as of December 31, 2020  8:39 AM. If you have any questions, ask your nurse or doctor.          amLODipine 5 MG tablet Commonly known as: NORVASC TAKE 1 TABLET BY MOUTH EVERY DAY   Bepotastine Besilate 1.5 % Soln SMARTSIG:In Eye(s)   chlorpheniramine 4 MG tablet Commonly known as: CHLOR-TRIMETON Take 4 mg by mouth daily.   clotrimazole-betamethasone cream Commonly known as: LOTRISONE APPLY TO AFFECTED AREA TWICE A DAY   desoximetasone 0.25 % cream Commonly known as: TOPICORT APPLY TO AFFECTED AREA TWICE A DAY   Fluocinolone Acetonide  0.01 % Oil Place 5 drops in ear(s) 2 (two) times daily as needed.   Klor-Con M20 20 MEQ tablet Generic drug: potassium chloride SA TAKE 1 TABLET BY MOUTH TWICE A DAY   magnesium oxide 400 MG tablet Commonly known as: MAG-OX Take 2 tablets (800 mg total) by mouth at bedtime.   omeprazole 40 MG capsule Commonly known as: PRILOSEC Take 20 mg by mouth once a week.   tamsulosin 0.4 MG Caps capsule Commonly known as: FLOMAX Take 0.4 mg by mouth.   valsartan-hydrochlorothiazide 320-25 MG tablet Commonly known as: DIOVAN-HCT TAKE 1 TABLET BY MOUTH EVERY DAY        History (reviewed): Past Medical History:  Diagnosis Date   Allergy    Arthritis    Asthma    as child- out grew this    Blood transfusion without reported diagnosis    1985 with ulcer   Cataract    right eye   Constipation    metamucil helps   Environmental allergies    GERD (gastroesophageal reflux disease)    Hypertension    Ulcer    1985   Past Surgical History:  Procedure Laterality Date   CATARACT EXTRACTION Right    COLONOSCOPY     POLYPECTOMY     Family History  Problem Relation Age of Onset   Colon cancer Maternal Grandfather 67   Stomach cancer Neg Hx    Colon polyps  Neg Hx    Rectal cancer Neg Hx    Social History   Socioeconomic History   Marital status: Single    Spouse name: Not on file   Number of children: 0   Years of education: 14   Highest education level: Associate degree: academic program  Occupational History   Occupation: Geophysicist/field seismologist    Comment: Works part time  Tobacco Use   Smoking status: Never   Smokeless tobacco: Never  Substance and Sexual Activity   Alcohol use: No    Alcohol/week: 0.0 standard drinks   Drug use: No   Sexual activity: Not on file  Other Topics Concern   Not on file  Social History Narrative   Lives alone. He is working part-time as Geophysicist/field seismologist. He enjoys biking in his free time.    Social Determinants of Health   Financial Resource Strain: Low  Risk    Difficulty of Paying Living Expenses: Not hard at all  Food Insecurity: No Food Insecurity   Worried About Charity fundraiser in the Last Year: Never true   [place] in the Last Year: Never true  Transportation Needs: No Transportation Needs   Lack of Transportation (Medical): No   Lack of Transportation (Non-Medical): No  Physical Activity: Inactive   Days of Exercise per Week: 0 days   Minutes of Exercise per Session: 0 min  Stress: No Stress Concern Present   Feeling of Stress : Not at all  Social Connections: Socially Isolated   Frequency of Communication with Friends and Family: More than three times a week   Frequency of Social Gatherings with Friends and Family: Twice a week   Attends Religious Services: Never   Marine scientist or Organizations: No   Attends Archivist Meetings: Never   Marital Status: Never married    Activities of Daily Living In your present state of health, do you have any difficulty performing the following activities: 12/31/2020  Hearing? Y  Comment has noticed some hearing loss in left ear.  Vision? N  Difficulty concentrating or making decisions? N  Walking or climbing stairs? N  Dressing or bathing? N  Doing errands, shopping? N  Preparing Food and eating ? N  Using the Toilet? N  In the past six months, have you accidently leaked urine? N  Do you have problems with loss of bowel control? N  Managing your Medications? N  Managing your Finances? N  Housekeeping or managing your Housekeeping? N  Some recent data might be hidden    Patient Education/Literacy How often do you need to have someone help you when you read instructions, pamphlets, or other written materials from your doctor or pharmacy?: 1 - Never What is the last grade level you completed in school?: Associates Degree  Exercise Current Exercise Habits: Home exercise routine, Type of exercise: walking;Other - see comments (biking), Time (Minutes):  30, Frequency (Times/Week): 3, Weekly Exercise (Minutes/Week): 90, Intensity: Mild, Exercise limited by: None identified  Diet Patient reports consuming 3 meals a day and 3 snack(s) a day Patient reports that his primary diet is: Regular Patient reports that she does have regular access to food.   Depression Screen PHQ 2/9 Scores 12/31/2020 07/16/2020 03/14/2019  PHQ - 2 Score 0 0 0     Fall Risk Fall Risk  12/31/2020 07/16/2020  Falls in the past year? 0 1  Number falls in past yr: 0 0  Injury with Fall? 0 1  Risk  for fall due to : No Fall Risks -  Follow up Falls evaluation completed -     Objective:   BP (!) 143/77 (BP Location: Left Arm, Patient Position: Sitting)   Pulse 65   Ht 5\' 5"  (1.651 m)   Wt 174 lb 1.3 oz (79 kg)   SpO2 96%   BMI 28.97 kg/m   Last Weight  Most recent update: 12/31/2020  8:22 AM    Weight  79 kg (174 lb 1.3 oz)             Body mass index is 28.97 kg/m.  Hearing/Vision  Steven Moreno did not have difficulty with hearing/understanding during the face-to-face interview Steven Moreno did not have difficulty with his vision during the face-to-face interview Reports that he has had a formal eye exam by an eye care professional within the past year Reports that he has not had a formal hearing evaluation within the past year  Cognitive Function: 6CIT Screen 12/31/2020  What Year? 0 points  What month? 0 points  What time? 0 points  Count back from 20 0 points  Months in reverse 0 points  Repeat phrase 0 points  Total Score 0    Normal Cognitive Function Screening: Yes (Normal:0-7, Significant for Dysfunction: >8)  Immunization & Health Maintenance Record Immunization History  Administered Date(s) Administered   Fluad Quad(high Dose 65+) 03/14/2019, 04/24/2020   Moderna Sars-Covid-2 Vaccination 04/14/2020, 06/11/2020   PFIZER Comirnaty(Gray Top)Covid-19 Tri-Sucrose Vaccine 08/13/2019, 09/13/2019   Pneumococcal Conjugate-13 07/16/2020   Pneumococcal  Polysaccharide-23 03/14/2019   Tdap 10/30/2020   Zoster Recombinat (Shingrix) 05/01/2018, 10/17/2018    Health Maintenance  Topic Date Due   COVID-19 Vaccine (5 - Booster) 01/16/2021 (Originally 10/10/2020)   INFLUENZA VACCINE  01/12/2021   COLONOSCOPY (Pts 45-20yrs Insurance coverage will need to be confirmed)  10/14/2025   TETANUS/TDAP  10/31/2030   PNA vac Low Risk Adult  Completed   Zoster Vaccines- Shingrix  Completed   HPV VACCINES  Aged Out   Hepatitis C Screening  Discontinued       Assessment  This is a routine wellness examination for Steven Moreno.  Health Maintenance: Due or Overdue There are no preventive care reminders to display for this patient.   Steven Moreno does not need a referral for Community Assistance: Care Management:   no Social Work:    no Prescription Assistance:  no Nutrition/Diabetes Education:  no   Plan:  Personalized Goals  Goals Addressed               This Visit's Progress     Patient Stated (pt-stated)        12/31/2020 AWV Goal: Exercise for General Health  Patient will verbalize understanding of the benefits of increased physical activity: Exercising regularly is important. It will improve your overall fitness, flexibility, and endurance. Regular exercise also will improve your overall health. It can help you control your weight, reduce stress, and improve your bone density. Over the next year, patient will increase physical activity as tolerated with a goal of at least 150 minutes of moderate physical activity per week.  You can tell that you are exercising at a moderate intensity if your heart starts beating faster and you start breathing faster but can still hold a conversation. Moderate-intensity exercise ideas include: Walking 1 mile (1.6 km) in about 15 minutes Biking Hiking Golfing Dancing Water aerobics Patient will verbalize understanding of everyday activities that increase physical activity by providing examples  like the  following: Yard work, such as: Sales promotion account executive Gardening Washing windows or floors Patient will be able to explain general safety guidelines for exercising:  Before you start a new exercise program, talk with your health care provider. Do not exercise so much that you hurt yourself, feel dizzy, or get very short of breath. Wear comfortable clothes and wear shoes with good support. Drink plenty of water while you exercise to prevent dehydration or heat stroke. Work out until your breathing and your heartbeat get faster.        Personalized Health Maintenance & Screening Recommendations  There are no preventive care reminders to display for this patient.  Lung Cancer Screening Recommended: no (Low Dose CT Chest recommended if Age 31-80 years, 30 pack-year currently smoking OR have quit w/in past 15 years) Hepatitis C Screening recommended: no HIV Screening recommended: no  Advanced Directives: Written information was not given per the patient's request.  Referrals & Orders No orders of the defined types were placed in this encounter.   Follow-up Plan Follow-up with Silverio Decamp, MD as planned Schedule Medicare wellness visit in one year. AVS printed and given to the patient.   I have personally reviewed and noted the following in the patient's chart:   Medical and social history Use of alcohol, tobacco or illicit drugs  Current medications and supplements Functional ability and status Nutritional status Physical activity Advanced directives List of other physicians Hospitalizations, surgeries, and ER visits in previous 12 months Vitals Screenings to include cognitive, depression, and falls Referrals and appointments  In addition, I have reviewed and discussed with patient certain preventive protocols, quality metrics, and best practice recommendations. A written  personalized care plan for preventive services as well as general preventive health recommendations were provided to patient.     Tinnie Gens, RN  12/31/2020

## 2021-01-13 ENCOUNTER — Ambulatory Visit (INDEPENDENT_AMBULATORY_CARE_PROVIDER_SITE_OTHER): Payer: Medicare HMO | Admitting: Sports Medicine

## 2021-01-13 DIAGNOSIS — N5089 Other specified disorders of the male genital organs: Secondary | ICD-10-CM | POA: Diagnosis not present

## 2021-01-13 NOTE — Progress Notes (Addendum)
    Procedures performed today:    None.  Independent interpretation of notes and tests performed by another provider:   None.  Brief History, Exam, Impression, and Recommendations:    Perineal mass in male This is a very pleasant 70 year old male, for the past 2 weeks he is noted a nontender mass on the posterior aspect of his scrotum/perineum in the subcutaneous tissues. It is nontender, no other symptoms. On exam there is a 1 cm subcutaneous well-defined and movable mass that feels like a sebaceous cyst. We will go ahead and get a scrotal ultrasound for further delineation, I do suspect we will simply be watching this for now.  Ultrasound shows a complex cystic mass, if this is still present after a week we should probably get an MRI of the pelvis and have urology take a look.    ___________________________________________ Gwen Her. Dianah Field, M.D., ABFM., CAQSM. Primary Care and [place] Instructor of [place] of [facility] of Medicine

## 2021-01-13 NOTE — Assessment & Plan Note (Addendum)
This is a very pleasant 70 year old male, for the past 2 weeks he is noted a nontender mass on the posterior aspect of his scrotum/perineum in the subcutaneous tissues. It is nontender, no other symptoms. On exam there is a 1 cm subcutaneous well-defined and movable mass that feels like a sebaceous cyst. We will go ahead and get a scrotal ultrasound for further delineation, I do suspect we will simply be watching this for now.  Ultrasound shows a complex cystic mass, if this is still present after a week we should probably get an MRI of the pelvis and have urology take a look.

## 2021-01-14 ENCOUNTER — Ambulatory Visit (INDEPENDENT_AMBULATORY_CARE_PROVIDER_SITE_OTHER): Payer: Medicare HMO

## 2021-01-14 ENCOUNTER — Other Ambulatory Visit: Payer: Self-pay

## 2021-01-14 DIAGNOSIS — R229 Localized swelling, mass and lump, unspecified: Secondary | ICD-10-CM

## 2021-01-14 DIAGNOSIS — N5089 Other specified disorders of the male genital organs: Secondary | ICD-10-CM

## 2021-02-01 ENCOUNTER — Other Ambulatory Visit: Payer: Self-pay | Admitting: Sports Medicine

## 2021-02-11 ENCOUNTER — Ambulatory Visit (INDEPENDENT_AMBULATORY_CARE_PROVIDER_SITE_OTHER): Payer: Medicare HMO | Admitting: Sports Medicine

## 2021-02-11 ENCOUNTER — Other Ambulatory Visit: Payer: Self-pay

## 2021-02-11 DIAGNOSIS — I878 Other specified disorders of veins: Secondary | ICD-10-CM

## 2021-02-11 DIAGNOSIS — N5089 Other specified disorders of the male genital organs: Secondary | ICD-10-CM | POA: Diagnosis not present

## 2021-02-11 NOTE — Progress Notes (Signed)
    Procedures performed today:    None.  Independent interpretation of notes and tests performed by another provider:   None.  Brief History, Exam, Impression, and Recommendations:    Chronic venous stasis Steven Moreno complains of mild swelling in the lower extremities, on exam he has trace to 1+ pitting edema bilaterally with bilateral symmetric negative Homans signs and very mild subcutaneous hemosiderin deposits below the knee and above the ankle all consistent with venous stasis dermatitis per He does wear compression hose and elevate when possible, this is really all he needs to do, return as needed for this. He and I did discuss the anatomy and pathophysiology of venous disc dermatitis.  Perineal mass in male Steven Moreno also had noted a nontender mass in the posterior aspect of his scrotum, anterior perineum in the subcutaneous tissues, on exam at the last visit there was a 1 cm subcutaneous well-defined movable mass that felt to be a sebaceous cyst, ultimately scrotal ultrasound showed a complex cystic structure, we decided to watch it for now and it has resolved on its own. No further intervention needed.    ___________________________________________ Gwen Her. Dianah Field, M.D., ABFM., CAQSM. Primary Care and [place] Instructor of [place] of [facility] of Medicine

## 2021-02-11 NOTE — Assessment & Plan Note (Signed)
Steven Moreno complains of mild swelling in the lower extremities, on exam he has trace to 1+ pitting edema bilaterally with bilateral symmetric negative Homans signs and very mild subcutaneous hemosiderin deposits below the knee and above the ankle all consistent with venous stasis dermatitis per He does wear compression hose and elevate when possible, this is really all he needs to do, return as needed for this. He and I did discuss the anatomy and pathophysiology of venous disc dermatitis.

## 2021-02-11 NOTE — Assessment & Plan Note (Signed)
Steven Moreno also had noted a nontender mass in the posterior aspect of his scrotum, anterior perineum in the subcutaneous tissues, on exam at the last visit there was a 1 cm subcutaneous well-defined movable mass that felt to be a sebaceous cyst, ultimately scrotal ultrasound showed a complex cystic structure, we decided to watch it for now and it has resolved on its own. No further intervention needed.

## 2021-02-21 ENCOUNTER — Other Ambulatory Visit: Payer: Self-pay | Admitting: Sports Medicine

## 2021-03-05 ENCOUNTER — Other Ambulatory Visit: Payer: Self-pay | Admitting: Sports Medicine

## 2021-03-05 DIAGNOSIS — M48061 Spinal stenosis, lumbar region without neurogenic claudication: Secondary | ICD-10-CM

## 2021-05-02 ENCOUNTER — Other Ambulatory Visit: Payer: Self-pay | Admitting: Sports Medicine

## 2021-06-04 ENCOUNTER — Encounter: Payer: Self-pay | Admitting: Internal Medicine

## 2021-06-17 ENCOUNTER — Other Ambulatory Visit: Payer: Self-pay

## 2021-06-17 ENCOUNTER — Ambulatory Visit (INDEPENDENT_AMBULATORY_CARE_PROVIDER_SITE_OTHER): Payer: Medicare HMO | Admitting: Sports Medicine

## 2021-06-17 DIAGNOSIS — L739 Follicular disorder, unspecified: Secondary | ICD-10-CM | POA: Diagnosis not present

## 2021-06-17 MED ORDER — DOXYCYCLINE HYCLATE 100 MG PO TABS: 100.0000 mg | ORAL_TABLET | Freq: Two times a day (BID) | ORAL | 0 refills | Status: AC

## 2021-06-17 NOTE — Progress Notes (Signed)
° ° °  Procedures performed today:    None.  Independent interpretation of notes and tests performed by another provider:   None.  Brief History, Exam, Impression, and Recommendations:    Folliculitis This is a very pleasant 71 year old male, he has noted a tender spot in his right upper back over the scapula. On exam there are widespread seborrheic keratoses, but he does have an area of erythema and induration that I suspect is a folliculitis. Adding a course of doxycycline, return to see me if not better in 4 to 6 weeks, will consider excisional biopsy at that time.    ___________________________________________ Gwen Her. Dianah Field, M.D., ABFM., CAQSM. Primary Care and [place] Instructor of [place] of [facility] of Medicine

## 2021-06-17 NOTE — Assessment & Plan Note (Signed)
This is a very pleasant 71 year old male, he has noted a tender spot in his right upper back over the scapula. On exam there are widespread seborrheic keratoses, but he does have an area of erythema and induration that I suspect is a folliculitis. Adding a course of doxycycline, return to see me if not better in 4 to 6 weeks, will consider excisional biopsy at that time.

## 2021-08-07 ENCOUNTER — Other Ambulatory Visit: Payer: Self-pay | Admitting: Sports Medicine

## 2021-08-24 ENCOUNTER — Other Ambulatory Visit: Payer: Self-pay | Admitting: Sports Medicine

## 2021-09-10 ENCOUNTER — Ambulatory Visit (INDEPENDENT_AMBULATORY_CARE_PROVIDER_SITE_OTHER): Payer: Medicare HMO | Admitting: Sports Medicine

## 2021-09-10 VITALS — BP 132/83 | HR 71 | Ht 65.0 in | Wt 176.0 lb

## 2021-09-10 DIAGNOSIS — I10 Essential (primary) hypertension: Secondary | ICD-10-CM | POA: Diagnosis not present

## 2021-09-10 DIAGNOSIS — H608X1 Other otitis externa, right ear: Secondary | ICD-10-CM

## 2021-09-10 DIAGNOSIS — Z0000 Encounter for general adult medical examination without abnormal findings: Secondary | ICD-10-CM | POA: Diagnosis not present

## 2021-09-10 DIAGNOSIS — R011 Cardiac murmur, unspecified: Secondary | ICD-10-CM

## 2021-09-10 DIAGNOSIS — R739 Hyperglycemia, unspecified: Secondary | ICD-10-CM

## 2021-09-10 DIAGNOSIS — N400 Enlarged prostate without lower urinary tract symptoms: Secondary | ICD-10-CM

## 2021-09-10 MED ORDER — FLUOCINOLONE ACETONIDE 0.01 % OT OIL: 5.0000 [drp] | TOPICAL_OIL | Freq: Two times a day (BID) | OTIC | 6 refills | Status: AC | PRN

## 2021-09-10 MED ORDER — ELOCON 0.1 % EX CREA: 1.0000 "application " | TOPICAL_CREAM | Freq: Every day | CUTANEOUS | 11 refills | Status: AC

## 2021-09-10 NOTE — Assessment & Plan Note (Signed)
Routine annual physical, up-to-date on screening measures, checking routine labs. ?Return to see me in 1 year for Medicare physical. ?

## 2021-09-10 NOTE — Assessment & Plan Note (Signed)
Mild 1/6 systolic murmur again heard, echo last year showed mostly physiologic changes. ?We can recheck an echo again next year. ?

## 2021-09-10 NOTE — Progress Notes (Addendum)
?Subjective:   ? ?CC: Annual Physical Exam ? ?HPI:  ?This patient is here for their annual physical ? ?I reviewed the past medical history, family history, social history, surgical history, and allergies today and no changes were needed.  Please see the problem list section below in epic for further details. ? ?Past Medical History: ?Past Medical History:  ?Diagnosis Date  ? Allergy   ? Arthritis   ? Asthma   ? as child- out grew this   ? Blood transfusion without reported diagnosis   ? 1985 with ulcer  ? Cataract   ? right eye  ? Constipation   ? metamucil helps  ? Environmental allergies   ? GERD (gastroesophageal reflux disease)   ? Hypertension   ? Ulcer   ? 1985  ? ?Past Surgical History: ?Past Surgical History:  ?Procedure Laterality Date  ? CATARACT EXTRACTION Right   ? COLONOSCOPY    ? POLYPECTOMY    ? ?Social History: ?Social History  ? ?Socioeconomic History  ? Marital status: Single  ?  Spouse name: Not on file  ? Number of children: 0  ? Years of education: 76  ? Highest education level: Associate degree: academic program  ?Occupational History  ? Occupation: Geophysicist/field seismologist  ?  Comment: Works part time  ?Tobacco Use  ? Smoking status: Never  ? Smokeless tobacco: Never  ?Substance and Sexual Activity  ? Alcohol use: No  ?  Alcohol/week: 0.0 standard drinks  ? Drug use: No  ? Sexual activity: Not on file  ?Other Topics Concern  ? Not on file  ?Social History Narrative  ? Lives alone. He is working part-time as Geophysicist/field seismologist. He enjoys biking in his free time.   ? ?Social Determinants of Health  ? ?Financial Resource Strain: Low Risk   ? Difficulty of Paying Living Expenses: Not hard at all  ?Food Insecurity: No Food Insecurity  ? Worried About Charity fundraiser in the Last Year: Never true  ? Ran Out of Food in the Last Year: Never true  ?Transportation Needs: No Transportation Needs  ? Lack of Transportation (Medical): No  ? Lack of Transportation (Non-Medical): No  ?Physical Activity: Inactive  ? Days of Exercise  per Week: 0 days  ? Minutes of Exercise per Session: 0 min  ?Stress: No Stress Concern Present  ? Feeling of Stress : Not at all  ?Social Connections: Socially Isolated  ? Frequency of Communication with Friends and Family: More than three times a week  ? Frequency of Social Gatherings with Friends and Family: Twice a week  ? Attends Religious Services: Never  ? Active Member of Clubs or Organizations: No  ? Attends Archivist Meetings: Never  ? Marital Status: Never married  ? ?Family History: ?Family History  ?Problem Relation Age of Onset  ? Colon cancer Maternal Grandfather 23  ? Stomach cancer Neg Hx   ? Colon polyps Neg Hx   ? Rectal cancer Neg Hx   ? ?Allergies: ?Allergies  ?Allergen Reactions  ? Aspirin   ?  Stomach pain, gi bleeding  ? ?Medications: See med rec. ? ?Review of Systems: No headache, visual changes, nausea, vomiting, diarrhea, constipation, dizziness, abdominal pain, skin rash, fevers, chills, night sweats, swollen lymph nodes, weight loss, chest pain, body aches, joint swelling, muscle aches, shortness of breath, mood changes, visual or auditory hallucinations. ? ?Objective:   ? ?General: Well Developed, well nourished, and in no acute distress.  ?Neuro: Alert and oriented x3, extra-ocular  muscles intact, sensation grossly intact. Cranial nerves II through XII are intact, motor, sensory, and coordinative functions are all intact. ?HEENT: Normocephalic, atraumatic, pupils equal round reactive to light, neck supple, no masses, no lymphadenopathy, thyroid nonpalpable. Oropharynx, nasopharynx, external ear canals are unremarkable. ?Skin: Warm and dry, no rashes noted.  ?Cardiac: Regular rate and rhythm, no rubs or gallops, 1/6 systolic murmur heard across the precordium. ?Respiratory: Clear to auscultation bilaterally. Not using accessory muscles, speaking in full sentences.  ?Abdominal: Soft, nontender, nondistended, positive bowel sounds, no masses, no organomegaly.  ?Musculoskeletal:  Shoulder, elbow, wrist, hip, knee, ankle stable, and with full range of motion. ? ?Impression and Recommendations:   ? ?The patient was counselled, risk factors were discussed, anticipatory guidance given. ? ?Systolic murmur ?Mild 1/6 systolic murmur again heard, echo last year showed mostly physiologic changes. ?We can recheck an echo again next year. ? ?Annual physical exam ?Routine annual physical, up-to-date on screening measures, checking routine labs. ?Return to see me in 1 year for Medicare physical. ? ?Hyperglycemia ?Adding a1c ? ? ?___________________________________________ ?Gwen Her. Dianah Field, M.D., ABFM., CAQSM. ?Primary Care and Sports Medicine ?[place] ? ?Adjunct Professor of Family Medicine  ?[employer] ?

## 2021-09-12 DIAGNOSIS — R739 Hyperglycemia, unspecified: Secondary | ICD-10-CM | POA: Insufficient documentation

## 2021-09-12 DIAGNOSIS — E119 Type 2 diabetes mellitus without complications: Secondary | ICD-10-CM | POA: Insufficient documentation

## 2021-09-12 NOTE — Addendum Note (Signed)
Addended by: Silverio Decamp on: 09/12/2021 08:47 AM ? ? Modules accepted: Orders ? ?

## 2021-09-12 NOTE — Assessment & Plan Note (Signed)
Adding a1c ?

## 2021-09-14 LAB — COMPREHENSIVE METABOLIC PANEL
AG Ratio: 1.7 (calc) (ref 1.0–2.5)
ALT: 45 U/L (ref 9–46)
AST: 39 U/L — ABNORMAL HIGH (ref 10–35)
Albumin: 4.3 g/dL (ref 3.6–5.1)
Alkaline phosphatase (APISO): 63 U/L (ref 35–144)
BUN: 12 mg/dL (ref 7–25)
CO2: 30 mmol/L (ref 20–32)
Calcium: 9.5 mg/dL (ref 8.6–10.3)
Chloride: 103 mmol/L (ref 98–110)
Creat: 0.93 mg/dL (ref 0.70–1.28)
Globulin: 2.5 g/dL (calc) (ref 1.9–3.7)
Glucose, Bld: 118 mg/dL — ABNORMAL HIGH (ref 65–99)
Potassium: 4 mmol/L (ref 3.5–5.3)
Sodium: 142 mmol/L (ref 135–146)
Total Bilirubin: 0.7 mg/dL (ref 0.2–1.2)
Total Protein: 6.8 g/dL (ref 6.1–8.1)

## 2021-09-14 LAB — PSA, TOTAL AND FREE
PSA, % Free: 39 % (calc) (ref >25)
PSA, Free: 0.9 ng/mL
PSA, Total: 2.3 ng/mL (ref <=4.0)

## 2021-09-14 LAB — LIPID PANEL
Cholesterol: 112 mg/dL (ref <200)
HDL: 40 mg/dL (ref >=40)
LDL Cholesterol (Calc): 58 mg/dL (calc)
Non-HDL Cholesterol (Calc): 72 mg/dL (calc) (ref <130)
Total CHOL/HDL Ratio: 2.8 (calc) (ref <5.0)
Triglycerides: 61 mg/dL (ref <150)

## 2021-09-14 LAB — CBC
HCT: 45.5 % (ref 38.5–50.0)
Hemoglobin: 15.3 g/dL (ref 13.2–17.1)
MCH: 32.1 pg (ref 27.0–33.0)
MCHC: 33.6 g/dL (ref 32.0–36.0)
MCV: 95.6 fL (ref 80.0–100.0)
MPV: 11.3 fL (ref 7.5–12.5)
Platelets: 177 10*3/uL (ref 140–400)
RBC: 4.76 10*6/uL (ref 4.20–5.80)
RDW: 13 % (ref 11.0–15.0)
WBC: 6.3 10*3/uL (ref 3.8–10.8)

## 2021-09-14 LAB — TSH: TSH: 2.67 mIU/L (ref 0.40–4.50)

## 2021-09-17 ENCOUNTER — Other Ambulatory Visit: Payer: Self-pay | Admitting: Sports Medicine

## 2021-11-07 ENCOUNTER — Other Ambulatory Visit: Payer: Self-pay | Admitting: Sports Medicine

## 2021-12-19 ENCOUNTER — Other Ambulatory Visit: Payer: Self-pay | Admitting: Sports Medicine

## 2022-02-02 ENCOUNTER — Other Ambulatory Visit: Payer: Self-pay | Admitting: Sports Medicine

## 2022-02-03 ENCOUNTER — Encounter: Payer: Self-pay | Admitting: General Practice

## 2022-02-10 ENCOUNTER — Encounter: Payer: Self-pay | Admitting: Sports Medicine

## 2022-02-10 ENCOUNTER — Ambulatory Visit (INDEPENDENT_AMBULATORY_CARE_PROVIDER_SITE_OTHER): Payer: Medicare HMO | Admitting: Sports Medicine

## 2022-02-10 VITALS — BP 138/82 | HR 66 | Ht 65.0 in | Wt 175.0 lb

## 2022-02-10 DIAGNOSIS — N400 Enlarged prostate without lower urinary tract symptoms: Secondary | ICD-10-CM

## 2022-02-10 DIAGNOSIS — Z0000 Encounter for general adult medical examination without abnormal findings: Secondary | ICD-10-CM

## 2022-02-10 LAB — POCT URINALYSIS DIP (CLINITEK)
Bilirubin, UA: NEGATIVE
Blood, UA: NEGATIVE
Glucose, UA: NEGATIVE mg/dL
Ketones, POC UA: NEGATIVE mg/dL
Leukocytes, UA: NEGATIVE
Nitrite, UA: NEGATIVE
POC PROTEIN,UA: NEGATIVE
Spec Grav, UA: 1.015 (ref 1.010–1.025)
Urobilinogen, UA: 0.2 E.U./dL
pH, UA: 7 (ref 5.0–8.0)

## 2022-02-10 MED ORDER — CIPROFLOXACIN HCL 750 MG PO TABS: 750.0000 mg | ORAL_TABLET | Freq: Two times a day (BID) | ORAL | 0 refills | Status: AC

## 2022-02-10 NOTE — Assessment & Plan Note (Signed)
Annual physical as above, up-to-date on screening measures.

## 2022-02-10 NOTE — Assessment & Plan Note (Signed)
Steven Moreno has had several episodes of prostatitis in the past, for the past couple of weeks has had worsening burning, urgency, frequency. We will treat him with a 2-week course of ciprofloxacin, I have also obtained a urine today and this will be sent for culture.

## 2022-02-10 NOTE — Addendum Note (Signed)
Addended by: Dema Severin on: 02/10/2022 02:54 PM   Modules accepted: Orders

## 2022-02-10 NOTE — Progress Notes (Signed)
Subjective:    CC: Annual Physical Exam  HPI:  This patient is here for their annual physical  I reviewed the past medical history, family history, social history, surgical history, and allergies today and no changes were needed.  Please see the problem list section below in epic for further details.  Past Medical History: Past Medical History:  Diagnosis Date   Allergy    Arthritis    Asthma    as child- out grew this    Blood transfusion without reported diagnosis    1985 with ulcer   Cataract    right eye   Constipation    metamucil helps   Environmental allergies    GERD (gastroesophageal reflux disease)    Hypertension    Ulcer    1985   Past Surgical History: Past Surgical History:  Procedure Laterality Date   CATARACT EXTRACTION Right    COLONOSCOPY     POLYPECTOMY     Social History: Social History   Socioeconomic History   Marital status: Single    Spouse name: Not on file   Number of children: 0   Years of education: 14   Highest education level: Associate degree: academic program  Occupational History   Occupation: Geophysicist/field seismologist    Comment: Works part time  Tobacco Use   Smoking status: Never   Smokeless tobacco: Never  Substance and Sexual Activity   Alcohol use: No    Alcohol/week: 0.0 standard drinks of alcohol   Drug use: No   Sexual activity: Not on file  Other Topics Concern   Not on file  Social History Narrative   Lives alone. He is working part-time as Geophysicist/field seismologist. He enjoys biking in his free time.    Social Determinants of Health   Financial Resource Strain: Low Risk  (12/31/2020)   Overall Financial Resource Strain (CARDIA)    Difficulty of Paying Living Expenses: Not hard at all  Food Insecurity: No Food Insecurity (12/31/2020)   Hunger Vital Sign    Worried About Running Out of Food in the Last Year: Never true    Ran Out of Food in the Last Year: Never true  Transportation Needs: No Transportation Needs (12/31/2020)   PRAPARE -  Hydrologist (Medical): No    Lack of Transportation (Non-Medical): No  Physical Activity: Inactive (12/31/2020)   Exercise Vital Sign    Days of Exercise per Week: 0 days    Minutes of Exercise per Session: 0 min  Stress: No Stress Concern Present (12/31/2020)   [place]    Feeling of Stress : Not at all  Social Connections: Socially Isolated (12/31/2020)   Social Connection and Isolation Panel [NHANES]    Frequency of Communication with Friends and Family: More than three times a week    Frequency of Social Gatherings with Friends and Family: Twice a week    Attends Religious Services: Never    Marine scientist or Organizations: No    Attends Music therapist: Never    Marital Status: Never married   Family History: Family History  Problem Relation Age of Onset   Colon cancer Maternal Grandfather 22   Stomach cancer Neg Hx    Colon polyps Neg Hx    Rectal cancer Neg Hx    Allergies: Allergies  Allergen Reactions   Aspirin     Stomach pain, gi bleeding   Medications: See med rec.  Review  of Systems: No headache, visual changes, nausea, vomiting, diarrhea, constipation, dizziness, abdominal pain, skin rash, fevers, chills, night sweats, swollen lymph nodes, weight loss, chest pain, body aches, joint swelling, muscle aches, shortness of breath, mood changes, visual or auditory hallucinations.  Objective:    General: Well Developed, well nourished, and in no acute distress.  Neuro: Alert and oriented x3, extra-ocular muscles intact, sensation grossly intact. Cranial nerves II through XII are intact, motor, sensory, and coordinative functions are all intact. HEENT: Normocephalic, atraumatic, pupils equal round reactive to light, neck supple, no masses, no lymphadenopathy, thyroid nonpalpable. Oropharynx, nasopharynx, external ear canals are unremarkable. Skin: Warm  and dry, no rashes noted.  Cardiac: Regular rate and rhythm, no murmurs rubs or gallops.  Respiratory: Clear to auscultation bilaterally. Not using accessory muscles, speaking in full sentences.  Abdominal: Soft, nontender, nondistended, positive bowel sounds, no masses, no organomegaly.  Musculoskeletal: Shoulder, elbow, wrist, hip, knee, ankle stable, and with full range of motion.  Impression and Recommendations:    The patient was counselled, risk factors were discussed, anticipatory guidance given.  Annual physical exam Annual physical as above, up-to-date on screening measures.  BPH (benign prostatic hyperplasia) Tavin has had several episodes of prostatitis in the past, for the past couple of weeks has had worsening burning, urgency, frequency. We will treat him with a 2-week course of ciprofloxacin, I have also obtained a urine today and this will be sent for culture.   ____________________________________________ Gwen Her. Dianah Field, M.D., ABFM., CAQSM., AME. Primary Care and Sports Medicine [facility]  Adjunct Professor of [place] [employer]  Risk manager

## 2022-02-12 LAB — URINE CULTURE
MICRO NUMBER:: 13857347
Result:: NO GROWTH
SPECIMEN QUALITY:: ADEQUATE

## 2022-05-07 ENCOUNTER — Other Ambulatory Visit: Payer: Self-pay | Admitting: Sports Medicine

## 2022-07-06 ENCOUNTER — Other Ambulatory Visit: Payer: Self-pay | Admitting: Sports Medicine

## 2022-07-30 ENCOUNTER — Other Ambulatory Visit: Payer: Self-pay | Admitting: Sports Medicine

## 2022-08-16 ENCOUNTER — Encounter: Payer: Self-pay | Admitting: Family Medicine

## 2022-08-16 ENCOUNTER — Ambulatory Visit (INDEPENDENT_AMBULATORY_CARE_PROVIDER_SITE_OTHER): Payer: Medicare HMO | Admitting: Family Medicine

## 2022-08-16 VITALS — BP 138/71 | HR 68 | Ht 65.0 in | Wt 174.0 lb

## 2022-08-16 DIAGNOSIS — R21 Rash and other nonspecific skin eruption: Secondary | ICD-10-CM | POA: Diagnosis not present

## 2022-08-16 MED ORDER — PREDNISONE 10 MG (48) PO TBPK
ORAL_TABLET | ORAL | 0 refills | Status: DC
Start: 2022-08-16 — End: 2022-09-15

## 2022-08-16 NOTE — Patient Instructions (Signed)
Start prednisone taper.  Let me know if worsening or not improving.

## 2022-08-16 NOTE — Progress Notes (Signed)
Steven Moreno - 72 y.o. male MRN [id number]  Date of birth: [birth year]  Subjective Chief Complaint  Patient presents with   Rash    HPI Steven Moreno is a 72 year old male with history of psoriatic arthritis here today with complaint of rash.  He has had rash for a couple weeks.  Rash located on the trunk as well as extremities.  Denies any pain but does have some mild itching associated with this.  He does have topicals to use for psoriasis but has not really tried anything so far as this seems different than psoriasis that he has had in the past.  He denies any new medications.  No changes to soaps or detergents.  He did have sinus symptoms a few months ago but nothing that he recalls recently.  Denies fever, chills blistering or rash.  ROS:  A comprehensive ROS was completed and negative except as noted per HPI  Allergies  Allergen Reactions   Aspirin     Stomach pain, gi bleeding    Past Medical History:  Diagnosis Date   Allergy    Arthritis    Asthma    as child- out grew this    Blood transfusion without reported diagnosis    1985 with ulcer   Cataract    right eye   Constipation    metamucil helps   Environmental allergies    GERD (gastroesophageal reflux disease)    Hypertension    Ulcer    1985    Past Surgical History:  Procedure Laterality Date   CATARACT EXTRACTION Right    COLONOSCOPY     POLYPECTOMY      Social History   Socioeconomic History   Marital status: Single    Spouse name: Not on file   Number of children: 0   Years of education: 14   Highest education level: Associate degree: academic program  Occupational History   Occupation: Geophysicist/field seismologist    Comment: Works part time  Tobacco Use   Smoking status: Never   Smokeless tobacco: Never  Substance and Sexual Activity   Alcohol use: No    Alcohol/week: 0.0 standard drinks of alcohol   Drug use: No   Sexual activity: Not on file  Other Topics Concern   Not on file  Social History  Narrative   Lives alone. He is working part-time as Geophysicist/field seismologist. He enjoys biking in his free time.    Social Determinants of Health   Financial Resource Strain: Low Risk  (12/31/2020)   Overall Financial Resource Strain (CARDIA)    Difficulty of Paying Living Expenses: Not hard at all  Food Insecurity: No Food Insecurity (12/31/2020)   Hunger Vital Sign    Worried About Running Out of Food in the Last Year: Never true    Ran Out of Food in the Last Year: Never true  Transportation Needs: No Transportation Needs (12/31/2020)   PRAPARE - Hydrologist (Medical): No    Lack of Transportation (Non-Medical): No  Physical Activity: Inactive (12/31/2020)   Exercise Vital Sign    Days of Exercise per Week: 0 days    Minutes of Exercise per Session: 0 min  Stress: No Stress Concern Present (12/31/2020)   [place]    Feeling of Stress : Not at all  Social Connections: Socially Isolated (12/31/2020)   Social Connection and Isolation Panel [NHANES]    Frequency of Communication with Friends and Family:  More than three times a week    Frequency of Social Gatherings with Friends and Family: Twice a week    Attends Religious Services: Never    Marine scientist or Organizations: No    Attends Music therapist: Never    Marital Status: Never married    Family History  Problem Relation Age of Onset   Colon cancer Maternal Grandfather 45   Stomach cancer Neg Hx    Colon polyps Neg Hx    Rectal cancer Neg Hx     Health Maintenance  Topic Date Due   Medicare Annual Wellness (AWV)  12/31/2021   COVID-19 Vaccine (5 - 2023-24 season) 02/12/2022   COLONOSCOPY (Pts 45-46yr Insurance coverage will need to be confirmed)  10/14/2025   DTaP/Tdap/Td (2 - Td or Tdap) 10/31/2030   Pneumonia Vaccine 72 Years old  Completed   INFLUENZA VACCINE  Completed   Zoster Vaccines- Shingrix  Completed    HPV VACCINES  Aged Out   Hepatitis C Screening  Discontinued     ----------------------------------------------------------------------------------------------------------------------------------------------------------------------------------------------------------------- Physical Exam BP 138/71   Pulse 68   Ht '5\' 5"'$  (1.651 m)   Wt 174 lb (78.9 kg)   SpO2 99%   BMI 28.96 kg/m   Physical Exam Constitutional:      Appearance: Normal appearance.  HENT:     Head: Normocephalic and atraumatic.  Eyes:     General: No scleral icterus. Musculoskeletal:     Cervical back: Neck supple.  Skin:    Comments: Multiple erythematous papules on the back and abdomen as well as proximal upper and lower extremities.  There is some mild scaling around some of these lesions.  Neurological:     Mental Status: He is alert.  Psychiatric:        Mood and Affect: Mood normal.        Behavior: Behavior normal.       ------------------------------------------------------------------------------------------------------------------------------------------------------------------------------------------------------------------- Assessment and Plan  Rash Unclear etiology.  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  Differential does include pityriasis rosea given distribution and mild nature of rash without any severe itching or pain.  I will start him on course of steroids to see if this provides improvement.  He does have an appointment with his dermatologist in about 3 weeks as well.  He will contact me if having worsening symptoms.   Meds ordered this encounter  Medications   predniSONE (STERAPRED UNI-PAK 48 TAB) 10 MG (48) TBPK tablet    Sig: Taper as directed on packaging. 12 day taper    Dispense:  48 tablet    Refill:  0    No follow-ups on file.    This visit occurred during the SARS-CoV-2 public health  emergency.  Safety protocols were in place, including screening questions prior to the visit, additional usage of staff PPE, and extensive cleaning of exam room while observing appropriate contact time as indicated for disinfecting solutions.

## 2022-08-16 NOTE — Assessment & Plan Note (Signed)
Unclear etiology.  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  Differential does include pityriasis rosea given distribution and mild nature of rash without any severe itching or pain.  I will start him on course of steroids to see if this provides improvement.  He does have an appointment with his dermatologist in about 3 weeks as well.  He will contact me if having worsening symptoms.

## 2022-09-08 ENCOUNTER — Telehealth: Payer: Self-pay

## 2022-09-08 MED ORDER — POTASSIUM CHLORIDE ER 20 MEQ PO TBCR: 1.0000 | EXTENDED_RELEASE_TABLET | Freq: Two times a day (BID) | ORAL | 3 refills | Status: DC

## 2022-09-08 NOTE — Addendum Note (Signed)
Addended by: Silverio Decamp on: 09/08/2022 10:20 PM   Modules accepted: Orders

## 2022-09-08 NOTE — Telephone Encounter (Signed)
Done

## 2022-09-08 NOTE — Telephone Encounter (Signed)
Patient left VM needs refill on potassium please send in.

## 2022-09-14 LAB — HM DIABETES EYE EXAM

## 2022-09-15 ENCOUNTER — Ambulatory Visit (INDEPENDENT_AMBULATORY_CARE_PROVIDER_SITE_OTHER): Payer: Medicare HMO | Admitting: Sports Medicine

## 2022-09-15 ENCOUNTER — Encounter: Payer: Self-pay | Admitting: Sports Medicine

## 2022-09-15 ENCOUNTER — Ambulatory Visit (INDEPENDENT_AMBULATORY_CARE_PROVIDER_SITE_OTHER): Payer: Medicare HMO

## 2022-09-15 VITALS — BP 152/82 | HR 67 | Ht 65.0 in | Wt 171.0 lb

## 2022-09-15 DIAGNOSIS — I10 Essential (primary) hypertension: Secondary | ICD-10-CM

## 2022-09-15 DIAGNOSIS — R739 Hyperglycemia, unspecified: Secondary | ICD-10-CM

## 2022-09-15 DIAGNOSIS — E119 Type 2 diabetes mellitus without complications: Secondary | ICD-10-CM | POA: Diagnosis not present

## 2022-09-15 DIAGNOSIS — Z0000 Encounter for general adult medical examination without abnormal findings: Secondary | ICD-10-CM | POA: Diagnosis not present

## 2022-09-15 DIAGNOSIS — L4050 Arthropathic psoriasis, unspecified: Secondary | ICD-10-CM | POA: Diagnosis not present

## 2022-09-15 DIAGNOSIS — N400 Enlarged prostate without lower urinary tract symptoms: Secondary | ICD-10-CM

## 2022-09-15 DIAGNOSIS — R011 Cardiac murmur, unspecified: Secondary | ICD-10-CM

## 2022-09-15 DIAGNOSIS — L404 Guttate psoriasis: Secondary | ICD-10-CM

## 2022-09-15 LAB — COMPREHENSIVE METABOLIC PANEL: eGFR: 86

## 2022-09-15 MED ORDER — OTEZLA 10 & 20 & 30 MG PO TBPK: ORAL_TABLET | ORAL | 0 refills | Status: DC

## 2022-09-15 NOTE — Assessment & Plan Note (Signed)
Again noted 1/6 systolic murmur across the precordium, echo from 2022 showed physiologic changes, without any stenosis of the aortic valve, we will just follow this clinically for now.

## 2022-09-15 NOTE — Assessment & Plan Note (Signed)
Increasing widespread achiness, neck pain, adding neck x-rays, home physical therapy. I also like him to start [place].

## 2022-09-15 NOTE — Progress Notes (Addendum)
 Subjective:   Steven Moreno is a 72 y.o. male who presents for Medicare Annual/Subsequent preventive examination.  Review of Systems    Comprehensive review of systems is negative     Objective:    Today's Vitals   09/15/22 0838  BP: (!) 152/82  Pulse: 67  SpO2: 96%  Weight: 171 lb (77.6 kg)  Height: 5' 5 (1.651 m)   Body mass index is 28.46 kg/m.     12/31/2020    8:20 AM 05/25/2016    2:31 PM  Advanced Directives  Does Patient Have a Medical Advance Directive? Yes Yes  Type of Advance Directive Living will;Healthcare Power of [employer] Power of [place];Living will  Does patient want to make changes to medical advance directive? No - Patient declined   Copy of Healthcare Power of Attorney in Chart? No - copy requested     Current Medications (verified) Outpatient Encounter Medications as of 09/15/2022  Medication Sig   amLODipine  (NORVASC ) 5 MG tablet TAKE 1 TABLET BY MOUTH EVERY DAY   Apremilast  (OTEZLA ) 10 & 20 & 30 MG TBPK 10 mg p.o. day 1, then twice daily day 2, then 10 mg in the morning and 20 mg in the evening day 3, then 20 mg twice daily day 4, then 20 mg in the morning and 30 mg in the evening day 5, then 30 mg twice daily.   Bepotastine Besilate 1.5 % SOLN SMARTSIG:In Eye(s)   clotrimazole -betamethasone  (LOTRISONE ) cream APPLY TO AFFECTED AREA TWICE A DAY   desoximetasone (TOPICORT) 0.25 % cream APPLY TO AFFECTED AREA TWICE A DAY   ELOCON  0.1 % cream Apply 1 application. topically daily.   Fluocinolone  Acetonide 0.01 % OIL Place 5 drops in ear(s) 2 (two) times daily as needed.   magnesium  oxide (MAG-OX) 400 MG tablet TAKE 2 TABLETS (800 MG TOTAL) BY MOUTH AT BEDTIME.   omeprazole (PRILOSEC) 40 MG capsule Take 20 mg by mouth once a week.    Potassium Chloride  ER 20 MEQ TBCR Take 1 tablet (20 mEq total) by mouth 2 (two) times daily.   tamsulosin (FLOMAX) 0.4 MG CAPS capsule Take 0.4 mg by mouth.   valsartan -hydrochlorothiazide  (DIOVAN -HCT) 320-25  MG tablet TAKE 1 TABLET BY MOUTH EVERY DAY   [DISCONTINUED] chlorpheniramine (CHLOR-TRIMETON) 4 MG tablet Take 4 mg by mouth daily.   [DISCONTINUED] predniSONE  (STERAPRED UNI-PAK 48 TAB) 10 MG (48) TBPK tablet Taper as directed on packaging. 12 day taper   Facility-Administered Encounter Medications as of 09/15/2022  Medication   0.9 %  sodium chloride  infusion    Allergies (verified) Aspirin   History: Past Medical History:  Diagnosis Date   Allergy    Arthritis    Asthma    as child- out grew this    Blood transfusion without reported diagnosis    1985 with ulcer   Cataract    right eye   Constipation    metamucil helps   Environmental allergies    GERD (gastroesophageal reflux disease)    Hypertension    Ulcer    1985   Past Surgical History:  Procedure Laterality Date   CATARACT EXTRACTION Right    COLONOSCOPY     POLYPECTOMY     Family History  Problem Relation Age of Onset   Colon cancer Maternal Grandfather 3   Stomach cancer Neg Hx    Colon polyps Neg Hx    Rectal cancer Neg Hx    Social History   Socioeconomic History  Marital status: Single    Spouse name: Not on file   Number of children: 0   Years of education: 14   Highest education level: Associate degree: academic program  Occupational History   Occupation: Hospital Doctor    Comment: Works part time  Tobacco Use   Smoking status: Never   Smokeless tobacco: Never  Substance and Sexual Activity   Alcohol use: No    Alcohol/week: 0.0 standard drinks of alcohol   Drug use: No   Sexual activity: Not on file  Other Topics Concern   Not on file  Social History Narrative   Lives alone. He is working part-time as hospital doctor. He enjoys biking in his free time.    Social Determinants of Health   Financial Resource Strain: Low Risk  (12/31/2020)   Overall Financial Resource Strain (CARDIA)    Difficulty of Paying Living Expenses: Not hard at all  Food Insecurity: No Food Insecurity (12/31/2020)   Hunger  Vital Sign    Worried About Running Out of Food in the Last Year: Never true    Ran Out of Food in the Last Year: Never true  Transportation Needs: No Transportation Needs (12/31/2020)   PRAPARE - Administrator, Civil Service (Medical): No    Lack of Transportation (Non-Medical): No  Physical Activity: Inactive (12/31/2020)   Exercise Vital Sign    Days of Exercise per Week: 0 days    Minutes of Exercise per Session: 0 min  Stress: No Stress Concern Present (12/31/2020)   [employer] of Occupational Health - Occupational Stress Questionnaire    Feeling of Stress : Not at all  Social Connections: Socially Isolated (12/31/2020)   Social Connection and Isolation Panel [NHANES]    Frequency of Communication with Friends and Family: More than three times a week    Frequency of Social Gatherings with Friends and Family: Twice a week    Attends Religious Services: Never    Database Administrator or Organizations: No    Attends Banker Meetings: Never    Marital Status: Never married    Tobacco Counseling Counseling given: Not Answered   Diabetic?No  Activities of Daily Living: Able to do all ADLs and IADLs   Patient Care Team: Curtis Debby PARAS, MD as PCP - General (Sports Medicine)  Indicate any recent Medical Services you may have received from other than [facility] providers in the past year (date may be approximate).     Assessment:   This is a routine wellness examination for Steven Moreno.  Hearing/Vision screen No results found.  Dietary issues and exercise activities discussed:     Goals Addressed   None   Depression Screen    09/15/2022    8:41 AM 02/10/2022    2:19 PM 09/10/2021    9:12 AM 12/31/2020    8:20 AM 07/16/2020   10:14 AM 03/14/2019    9:47 AM  PHQ 2/9 Scores  PHQ - 2 Score 0 0 0 0 0 0    Fall Risk    09/15/2022    8:41 AM 02/10/2022    2:19 PM 09/10/2021    9:17 AM 12/31/2020    8:20 AM 07/16/2020   10:14 AM  Fall Risk   Falls  in the past year? 0 0 0 0 1  Number falls in past yr: 0 0 0 0 0  Injury with Fall? 0 0 0 0 1  Risk for fall due to :  No Fall Risks  No Fall Risks   Follow up Falls evaluation completed Falls evaluation completed  Falls evaluation completed     FALL RISK PREVENTION PERTAINING TO THE HOME:  Any stairs in or around the home? Yes  If so, are there any without handrails? No  Home free of loose throw rugs in walkways, pet beds, electrical cords, etc? Yes  Adequate lighting in your home to reduce risk of falls? Yes   ASSISTIVE DEVICES UTILIZED TO PREVENT FALLS:  Life alert? No  Use of a cane, walker or w/c? No  Grab bars in the bathroom? No  Shower chair or bench in shower? No  Elevated toilet seat or a handicapped toilet? No   TIMED UP AND GO:  Was the test performed? Not needed.  Gait steady and fast without use of assistive device  Cognitive Function:        12/31/2020    8:25 AM  6CIT Screen  What Year? 0 points  What month? 0 points  What time? 0 points  Count back from 20 0 points  Months in reverse 0 points  Repeat phrase 0 points  Total Score 0 points    Immunizations Immunization History  Administered Date(s) Administered   Fluad Quad(high Dose 65+) 03/14/2019, 04/24/2020, 04/11/2022   Moderna Sars-Covid-2 Vaccination 04/14/2020, 06/11/2020   PFIZER Comirnaty(Gray Top)Covid-19 Tri-Sucrose Vaccine 08/13/2019, 09/13/2019   Pneumococcal Conjugate-13 07/16/2020   Pneumococcal Polysaccharide-23 03/14/2019   Pneumococcal-Unspecified 03/14/2019   Tdap 10/30/2020   Zoster Recombinat (Shingrix) 05/01/2018, 10/17/2018    TDAP status: Up to date  Flu Vaccine status: Up to date  Pneumococcal vaccine status: Up to date  Covid-19 vaccine status: Information provided on how to obtain vaccines.   Qualifies for Shingles Vaccine? Up to date  Screening Tests Health Maintenance  Topic Date Due   FOOT EXAM  Never done   OPHTHALMOLOGY EXAM  Never done   Diabetic  kidney evaluation - Urine ACR  Never done   COVID-19 Vaccine (5 - 2023-24 season) 02/12/2022   INFLUENZA VACCINE  01/13/2023   HEMOGLOBIN A1C  03/17/2023   Diabetic kidney evaluation - eGFR measurement  09/15/2023   Medicare Annual Wellness (AWV)  09/15/2023   COLONOSCOPY (Pts 45-62yrs Insurance coverage will need to be confirmed)  10/14/2025   DTaP/Tdap/Td (2 - Td or Tdap) 10/31/2030   Pneumonia Vaccine 37+ Years old  Completed   Zoster Vaccines- Shingrix  Completed   HPV VACCINES  Aged Out   Hepatitis C Screening  Discontinued    Health Maintenance  Health Maintenance Due  Topic Date Due   FOOT EXAM  Never done   OPHTHALMOLOGY EXAM  Never done   Diabetic kidney evaluation - Urine ACR  Never done   COVID-19 Vaccine (5 - 2023-24 season) 02/12/2022    Colorectal cancer screening: No longer required.   Lung Cancer Screening: (Low Dose CT Chest recommended if Age 43-80 years, 30 pack-year currently smoking OR have quit w/in 15years.) does not qualify.   Lung Cancer Screening Referral: Not needed  Additional Screening:  Hepatitis C Screening: does not qualify;   Dental Screening: Recommended annual dental exams for proper oral hygiene  Community Resource Referral / Chronic Care Management: CRR required this visit?  No   CCM required this visit?  No      Plan:    Annual physical exam Annual Medicare physical as above. Checking routine labs.   Benign essential hypertension Blood pressure moderately elevated today, asymptomatic, Kolby will check his blood pressures at home  and let me know how they look, he has been compliant with his amlodipine  and valsartan /hydrochlorothiazide .  Guttate psoriasis Known psoriasis, about a month ago developed a widespread rash, erythematous with scaly plaques over the entire body, but predominantly truncal. He does have a dermatologist, he saw Dr. Alvia and was treated with prednisone , the rash went away so he did not see his  dermatologist, it has now come back, I think he will certainly need biologic agents here, we will do another referral back to [facility] dermatology.  Controlled type 2 diabetes mellitus without complication, without long-term current use of insulin Rechecking hemoglobin A1c.  Update: hemoglobin A1c is in the diabetic range now, I would like to do a telephone visit with him to discuss the implications and additional screenings and treatments needed.  Systolic murmur Again noted 1/6 systolic murmur across the precordium, echo from 2022 showed physiologic changes, without any stenosis of the aortic valve, we will just follow this clinically for now.  Psoriatic arthritis Increasing widespread achiness, neck pain, adding neck x-rays, home physical therapy. I also like him to start [place] .  I have personally reviewed and noted the following in the patient's chart:   Medical and social history Use of alcohol, tobacco or illicit drugs  Current medications and supplements including opioid prescriptions. Patient is not currently taking opioid prescriptions. Functional ability and status Nutritional status Physical activity Advanced directives List of other physicians Hospitalizations, surgeries, and ER visits in previous 12 months Vitals Screenings to include cognitive, depression, and falls Referrals and appointments  In addition, I have reviewed and discussed with patient certain preventive protocols, quality metrics, and best practice recommendations. A written personalized care plan for preventive services as well as general preventive health recommendations were provided to patient.     ___________________________________________ Debby PARAS. Curtis, M.D., ABFM., CAQSM., AME. Primary Care and Sports Medicine [facility]  Adjunct Instructor of Family Medicine  [employer]  Restaurant Manager, [employer]

## 2022-09-15 NOTE — Assessment & Plan Note (Addendum)
Rechecking hemoglobin A1c.  Update: hemoglobin A1c is in the diabetic range now, I would like to do a telephone visit with him to discuss the implications and additional screenings and treatments needed.

## 2022-09-15 NOTE — Assessment & Plan Note (Signed)
Annual Medicare physical as above. Checking routine labs.

## 2022-09-15 NOTE — Assessment & Plan Note (Signed)
Blood pressure moderately elevated today, asymptomatic, Steven Moreno will check his blood pressures at home and let me know how they look, he has been compliant with his amlodipine and valsartan/hydrochlorothiazide.

## 2022-09-15 NOTE — Assessment & Plan Note (Signed)
Known psoriasis, about a month ago developed a widespread rash, erythematous with scaly plaques over the entire body, but predominantly truncal. He does have a dermatologist, he saw Dr. Zigmund Daniel and was treated with prednisone, the rash went away so he did not see his dermatologist, it has now come back, I think he will certainly need biologic agents here, we will do another referral back to [facility] dermatology.

## 2022-09-15 NOTE — Progress Notes (Deleted)
Subjective:    CC: Annual Physical Exam  HPI:  This patient is here for their annual physical  I reviewed the past medical history, family history, social history, surgical history, and allergies today and no changes were needed.  Please see the problem list section below in epic for further details.  Past Medical History: Past Medical History:  Diagnosis Date   Allergy    Arthritis    Asthma    as child- out grew this    Blood transfusion without reported diagnosis    1985 with ulcer   Cataract    right eye   Constipation    metamucil helps   Environmental allergies    GERD (gastroesophageal reflux disease)    Hypertension    Ulcer    1985   Past Surgical History: Past Surgical History:  Procedure Laterality Date   CATARACT EXTRACTION Right    COLONOSCOPY     POLYPECTOMY     Social History: Social History   Socioeconomic History   Marital status: Single    Spouse name: Not on file   Number of children: 0   Years of education: 14   Highest education level: Associate degree: academic program  Occupational History   Occupation: Geophysicist/field seismologist    Comment: Works part time  Tobacco Use   Smoking status: Never   Smokeless tobacco: Never  Substance and Sexual Activity   Alcohol use: No    Alcohol/week: 0.0 standard drinks of alcohol   Drug use: No   Sexual activity: Not on file  Other Topics Concern   Not on file  Social History Narrative   Lives alone. He is working part-time as Geophysicist/field seismologist. He enjoys biking in his free time.    Social Determinants of Health   Financial Resource Strain: Low Risk  (12/31/2020)   Overall Financial Resource Strain (CARDIA)    Difficulty of Paying Living Expenses: Not hard at all  Food Insecurity: No Food Insecurity (12/31/2020)   Hunger Vital Sign    Worried About Running Out of Food in the Last Year: Never true    Ran Out of Food in the Last Year: Never true  Transportation Needs: No Transportation Needs (12/31/2020)   PRAPARE -  Hydrologist (Medical): No    Lack of Transportation (Non-Medical): No  Physical Activity: Inactive (12/31/2020)   Exercise Vital Sign    Days of Exercise per Week: 0 days    Minutes of Exercise per Session: 0 min  Stress: No Stress Concern Present (12/31/2020)   [place]    Feeling of Stress : Not at all  Social Connections: Socially Isolated (12/31/2020)   Social Connection and Isolation Panel [NHANES]    Frequency of Communication with Friends and Family: More than three times a week    Frequency of Social Gatherings with Friends and Family: Twice a week    Attends Religious Services: Never    Marine scientist or Organizations: No    Attends Music therapist: Never    Marital Status: Never married   Family History: Family History  Problem Relation Age of Onset   Colon cancer Maternal Grandfather 22   Stomach cancer Neg Hx    Colon polyps Neg Hx    Rectal cancer Neg Hx    Allergies: Allergies  Allergen Reactions   Aspirin     Stomach pain, gi bleeding   Medications: See med rec.  Review  of Systems: No headache, visual changes, nausea, vomiting, diarrhea, constipation, dizziness, abdominal pain, skin rash, fevers, chills, night sweats, swollen lymph nodes, weight loss, chest pain, body aches, joint swelling, muscle aches, shortness of breath, mood changes, visual or auditory hallucinations.  Objective:    General: Well Developed, well nourished, and in no acute distress.  Neuro: Alert and oriented x3, extra-ocular muscles intact, sensation grossly intact. Cranial nerves II through XII are intact, motor, sensory, and coordinative functions are all intact. HEENT: Normocephalic, atraumatic, pupils equal round reactive to light, neck supple, no masses, no lymphadenopathy, thyroid nonpalpable. Oropharynx, nasopharynx, external ear canals are unremarkable. Skin: Warm  and dry, no rashes noted.  Cardiac: Regular rate and rhythm, no murmurs rubs or gallops.  Respiratory: Clear to auscultation bilaterally. Not using accessory muscles, speaking in full sentences.  Abdominal: Soft, nontender, nondistended, positive bowel sounds, no masses, no organomegaly.  Musculoskeletal: Shoulder, elbow, wrist, hip, knee, ankle stable, and with full range of motion.  Impression and Recommendations:    The patient was counselled, risk factors were discussed, anticipatory guidance given.  No problem-specific Assessment & Plan notes found for this encounter.   ____________________________________________ Gwen Her. Dianah Field, M.D., ABFM., CAQSM., AME. Primary Care and Sports Medicine [place] [facility]  Adjunct Professor of [place] of [employer] of Medicine  Risk manager

## 2022-09-16 LAB — HEMOGLOBIN A1C
Hgb A1c MFr Bld: 7.5 % of total Hgb — ABNORMAL HIGH (ref <5.7)
Mean Plasma Glucose: 169 mg/dL
eAG (mmol/L): 9.3 mmol/L

## 2022-09-16 LAB — CBC
HCT: 43.5 % (ref 38.5–50.0)
Hemoglobin: 14.5 g/dL (ref 13.2–17.1)
MCH: 31.8 pg (ref 27.0–33.0)
MCHC: 33.3 g/dL (ref 32.0–36.0)
MCV: 95.4 fL (ref 80.0–100.0)
MPV: 10.4 fL (ref 7.5–12.5)
Platelets: 242 10*3/uL (ref 140–400)
RBC: 4.56 10*6/uL (ref 4.20–5.80)
RDW: 13 % (ref 11.0–15.0)
WBC: 6.4 10*3/uL (ref 3.8–10.8)

## 2022-09-16 LAB — LIPID PANEL
Cholesterol: 118 mg/dL (ref <200)
HDL: 40 mg/dL (ref >=40)
LDL Cholesterol (Calc): 60 mg/dL (calc)
Non-HDL Cholesterol (Calc): 78 mg/dL (calc) (ref <130)
Total CHOL/HDL Ratio: 3 (calc) (ref <5.0)
Triglycerides: 93 mg/dL (ref <150)

## 2022-09-16 LAB — COMPREHENSIVE METABOLIC PANEL
AG Ratio: 1.8 (calc) (ref 1.0–2.5)
ALT: 47 U/L — ABNORMAL HIGH (ref 9–46)
AST: 42 U/L — ABNORMAL HIGH (ref 10–35)
Albumin: 4.1 g/dL (ref 3.6–5.1)
Alkaline phosphatase (APISO): 58 U/L (ref 35–144)
BUN: 11 mg/dL (ref 7–25)
CO2: 29 mmol/L (ref 20–32)
Calcium: 9.1 mg/dL (ref 8.6–10.3)
Chloride: 102 mmol/L (ref 98–110)
Creat: 0.94 mg/dL (ref 0.70–1.28)
Globulin: 2.3 g/dL (calc) (ref 1.9–3.7)
Glucose, Bld: 120 mg/dL — ABNORMAL HIGH (ref 65–99)
Potassium: 3.7 mmol/L (ref 3.5–5.3)
Sodium: 142 mmol/L (ref 135–146)
Total Bilirubin: 0.7 mg/dL (ref 0.2–1.2)
Total Protein: 6.4 g/dL (ref 6.1–8.1)

## 2022-09-16 LAB — PSA, TOTAL AND FREE
PSA, % Free: 25 % (calc) — ABNORMAL LOW (ref >25)
PSA, Free: 0.8 ng/mL
PSA, Total: 3.2 ng/mL (ref <=4.0)

## 2022-09-16 LAB — TSH: TSH: 1.99 mIU/L (ref 0.40–4.50)

## 2022-09-23 ENCOUNTER — Telehealth (INDEPENDENT_AMBULATORY_CARE_PROVIDER_SITE_OTHER): Payer: Medicare HMO | Admitting: Sports Medicine

## 2022-09-23 ENCOUNTER — Encounter: Payer: Self-pay | Admitting: Sports Medicine

## 2022-09-23 DIAGNOSIS — E119 Type 2 diabetes mellitus without complications: Secondary | ICD-10-CM

## 2022-09-23 NOTE — Assessment & Plan Note (Signed)
New diagnosis diabetes, discussed pathophysiology. Eye exam done, we will pull records. He will do a diabetic eating plan for 3 months and then we can recheck his A1c. At the follow-up visit we will get him caught up on foot exam, vaccinations, and urine protein.

## 2022-09-23 NOTE — Progress Notes (Signed)
   Virtual Visit via Telephone   I connected with  Steven Moreno  on 09/23/22 by telephone/telehealth and verified that I am speaking with the correct person using two identifiers.   I discussed the limitations, risks, security and privacy concerns of performing an evaluation and management service by telephone, including the higher likelihood of inaccurate diagnosis and treatment, and the availability of in person appointments.  We also discussed the likely need of an additional face to face encounter for complete and high quality delivery of care.  I also discussed with the patient that there may be a patient responsible charge related to this service. The patient expressed understanding and wishes to proceed.  Provider location is in medical facility. Patient location is at their home, different from provider location. People involved in care of the patient during this telehealth encounter were myself, my nurse/medical assistant, and my front office/scheduling team member.  Review of Systems: No fevers, chills, night sweats, weight loss, chest pain, or shortness of breath.   Objective Findings:    General: Speaking full sentences, no audible heavy breathing.  Sounds alert and appropriately interactive.    Independent interpretation of tests performed by another provider:   None.  Brief History, Exam, Impression, and Recommendations:    Controlled type 2 diabetes mellitus without complication, without long-term current use of insulin New diagnosis diabetes, discussed pathophysiology. Eye exam done, we will pull records. He will do a diabetic eating plan for 3 months and then we can recheck his A1c. At the follow-up visit we will get him caught up on foot exam, vaccinations, and urine protein.   I discussed the above assessment and treatment plan with the patient. The patient was provided an opportunity to ask questions and all were answered. The patient agreed with the plan and  demonstrated an understanding of the instructions.   The patient was advised to call back or seek an in-person evaluation if the symptoms worsen or if the condition fails to improve as anticipated.   I provided 30 minutes of verbal and non-verbal time during this encounter date, time was needed to gather information, review chart, records, communicate/coordinate with staff remotely, as well as complete documentation.   ____________________________________________ Steven Moreno. Benjamin Stain, M.D., ABFM., CAQSM., AME. Primary Care and Sports Medicine [place] [facility]  Adjunct Professor of Family Medicine  [place] of [facility] of Medicine  Restaurant manager, fast food

## 2022-09-28 NOTE — Addendum Note (Signed)
Addended by: Monica Becton on: 09/28/2022 10:35 AM   Modules accepted: Orders

## 2022-10-27 ENCOUNTER — Encounter: Payer: Self-pay | Admitting: Sports Medicine

## 2022-10-27 ENCOUNTER — Ambulatory Visit (INDEPENDENT_AMBULATORY_CARE_PROVIDER_SITE_OTHER): Payer: Medicare HMO | Admitting: Sports Medicine

## 2022-10-27 VITALS — BP 136/78 | HR 73 | Ht 65.0 in | Wt 164.0 lb

## 2022-10-27 DIAGNOSIS — I10 Essential (primary) hypertension: Secondary | ICD-10-CM

## 2022-10-27 DIAGNOSIS — E119 Type 2 diabetes mellitus without complications: Secondary | ICD-10-CM | POA: Diagnosis not present

## 2022-10-27 NOTE — Progress Notes (Signed)
    Procedures performed today:    None.  Independent interpretation of notes and tests performed by another provider:   None.  Brief History, Exam, Impression, and Recommendations:    Benign essential hypertension Blood pressure is often somewhat elevated in the office but runs normal at home. Continue current blood pressure medications.  Controlled type 2 diabetes mellitus without complication, without long-term current use of insulin (HCC) New diabetes diagnosis, he will do a diabetic eating plan, eye exam is done, we will recheck his A1c in July. At the follow-up visit we will get urine microalbumin. I did a foot exam today.    ____________________________________________ [name]. Benjamin Stain, M.D., ABFM., CAQSM., AME. Primary Care and Sports Medicine [place] [facility]  Adjunct Professor of Family Medicine  [place] [employer] [facility] [employer] of Medicine  Restaurant manager, fast food

## 2022-10-27 NOTE — Assessment & Plan Note (Signed)
Blood pressure is often somewhat elevated in the office but runs normal at home. Continue current blood pressure medications.

## 2022-10-27 NOTE — Assessment & Plan Note (Signed)
New diabetes diagnosis, he will do a diabetic eating plan, eye exam is done, we will recheck his A1c in July. At the follow-up visit we will get urine microalbumin. I did a foot exam today.

## 2022-11-04 ENCOUNTER — Encounter: Payer: Medicare HMO | Attending: Sports Medicine | Admitting: Skilled Nursing Facility1

## 2022-11-04 VITALS — Wt 162.6 lb

## 2022-11-04 DIAGNOSIS — E119 Type 2 diabetes mellitus without complications: Secondary | ICD-10-CM | POA: Diagnosis present

## 2022-11-05 ENCOUNTER — Encounter: Payer: Self-pay | Admitting: Skilled Nursing Facility1

## 2022-11-05 NOTE — Progress Notes (Signed)
Diabetes Self-Management Education  Visit Type: First/Initial  Patient was seen on 11/04/2022 for the first of a series of three diabetes self-management courses [facility]  Patient Education Plan per assessed needs and concerns is to attend three course education program for Diabetes Self Management Education.  A1C was 7.5   The following learning objectives were met by the patient during this class: Describe diabetes, types of diabetes and pathophysiology State some common risk factors for diabetes Defines the role of glucose and insulin Describe the relationship between diabetes and cardiovascular and other risks State the members of the Healthcare Team States the rationale for glucose monitoring and when to test State their individual Target Range State the importance of logging glucose readings and how to interpret the readings Identifies A1C target Explain the correlation between A1c and eAG values State symptoms and treatment of high blood glucose and low blood glucose Explain proper technique for glucose testing and identify proper sharps disposal  Handouts given during class include: How to Thrive:  A Guide for Your Journey with Diabetes by the ADA Meal Plan Card and carbohydrate content list Dietary intake form Low Sodium Flavoring Tips Types of Fats Dining Out Label reading Snack list The diabetes portion plate Diabetes Resources A1c to eAG Conversion Chart Blood Glucose Log Diabetes Recommended Care Schedule Support Group Diabetes Success Plan Core Class Satisfaction Survey   Follow-Up Plan: Attend core 2    11/05/2022  Mr. Steven Moreno, identified by name and date of birth, is a 72 y.o. male with a diagnosis of Diabetes: Type 2.   ASSESSMENT  Weight 162 lb 9.6 oz (73.8 kg). Body mass index is 27.06 kg/m.   Diabetes Self-Management Education - 11/05/22 0858       Visit Information   Visit Type First/Initial       Initial Visit   Diabetes Type Type 2    Are you currently following a meal plan? No    Are you taking your medications as prescribed? Yes      Health Coping   How would you rate your overall health? Good      Psychosocial Assessment   Patient Belief/Attitude about Diabetes Motivated to manage diabetes    What is the hardest part about your diabetes right now, causing you the most concern, or is the most worrisome to you about your diabetes?   Making healty food and beverage choices    Self-care barriers None    Self-management support Family    Patient Concerns Nutrition/Meal planning    Special Needs None    Preferred Learning Style Auditory;Visual    Learning Readiness Change in progress    How often do you need to have someone help you when you read instructions, pamphlets, or other written materials from your doctor or pharmacy? 1 - Never      Pre-Education Assessment   Patient understands the diabetes disease and treatment process. Needs Instruction    Patient understands incorporating nutritional management into lifestyle. Needs Instruction    Patient undertands incorporating physical activity into lifestyle. Needs Instruction    Patient understands using medications safely. Needs Instruction    Patient understands monitoring blood glucose, interpreting and using results Needs Instruction    Patient understands prevention, detection, and treatment of acute complications. Needs Instruction    Patient understands prevention, detection, and treatment of chronic complications. Needs Instruction    Patient understands how to develop strategies to address psychosocial issues. Needs Instruction  Patient understands how to develop strategies to promote health/change behavior. Needs Instruction      Complications   Last HgB A1C per patient/outside source 7.5 %    How often do you check your blood sugar? 0 times/day (not testing)    Have you had a dilated eye exam in the past 12  months? Yes    Have you had a dental exam in the past 12 months? Yes    Are you checking your feet? Yes    How many days per week are you checking your feet? 1      Activity / Exercise   Activity / Exercise Type Light (walking / raking leaves)    How many days per week do you exercise? 3    How many minutes per day do you exercise? 20    Total minutes per week of exercise 60      Patient Education   Previous Diabetes Education No    Disease Pathophysiology Definition of diabetes, type 1 and 2, and the diagnosis of diabetes;Factors that contribute to the development of diabetes    Medications Reviewed patients medication for diabetes, action, purpose, timing of dose and side effects.;Taught/reviewed insulin/injectables, injection, site rotation, insulin/injectables storage and needle disposal.;Reviewed medication adjustment guidelines for hyperglycemia and sick days.    Monitoring Taught/evaluated SMBG meter.;Purpose and frequency of SMBG.;Daily foot exams;Yearly dilated eye exam    Acute complications Taught prevention, symptoms, and  treatment of hypoglycemia - the 15 rule.;Discussed and identified patients' prevention, symptoms, and treatment of hyperglycemia.    Chronic complications Dental care;Retinopathy and reason for yearly dilated eye exams;Nephropathy, what it is, prevention of, the use of ACE, ARB's and early detection of through urine microalbumia.;Relationship between chronic complications and blood glucose control      Individualized Goals (developed by patient)   Medications Not Applicable    Monitoring  Test my blood glucose as discussed;Test blood glucose pre and post meals as discussed    Reducing Risk do foot checks daily;treat hypoglycemia with 15 grams of carbs if blood glucose less than 70mg /dL      Post-Education Assessment   Patient understands the diabetes disease and treatment process. Demonstrates understanding / competency    Patient understands incorporating  nutritional management into lifestyle. Needs Review    Patient undertands incorporating physical activity into lifestyle. Needs Review    Patient understands using medications safely. Demonstrates understanding / competency    Patient understands monitoring blood glucose, interpreting and using results Demonstrates understanding / competency    Patient understands prevention, detection, and treatment of acute complications. Demonstrates understanding / competency    Patient understands prevention, detection, and treatment of chronic complications. Demonstrates understanding / competency    Patient understands how to develop strategies to address psychosocial issues. Needs Review    Patient understands how to develop strategies to promote health/change behavior. Needs Review      Outcomes   Expected Outcomes Demonstrated interest in learning. Expect positive outcomes    Future DMSE 2 wks    Program Status Not Completed             Individualized Plan for Diabetes Self-Management Training:   Learning Objective:  Patient will have a greater understanding of diabetes self-management. Patient education plan is to attend individual and/or group sessions per assessed needs and concerns.   Expected Outcomes:  Demonstrated interest in learning. Expect positive outcomes  Education material provided: ADA - How to Thrive: A Guide for Your Journey with Diabetes, Food  label handouts, Meal plan card, My Plate, and Snack sheet  If problems or questions, patient to contact team via:  Phone and Email  Future DSME appointment: 2 wks

## 2022-11-11 ENCOUNTER — Encounter: Payer: Medicare HMO | Admitting: Skilled Nursing Facility1

## 2022-11-11 DIAGNOSIS — E119 Type 2 diabetes mellitus without complications: Secondary | ICD-10-CM

## 2022-11-12 ENCOUNTER — Encounter: Payer: Self-pay | Admitting: Skilled Nursing Facility1

## 2022-11-12 NOTE — Progress Notes (Signed)

## 2022-11-18 ENCOUNTER — Encounter: Payer: Self-pay | Admitting: Dietician

## 2022-11-18 ENCOUNTER — Encounter: Payer: Medicare HMO | Attending: Sports Medicine | Admitting: Dietician

## 2022-11-18 DIAGNOSIS — E119 Type 2 diabetes mellitus without complications: Secondary | ICD-10-CM | POA: Insufficient documentation

## 2022-11-18 NOTE — Progress Notes (Signed)
Patient was seen on 11/18/22 for the third of a series of three diabetes self-management courses [facility].   State the amount of activity recommended for healthy living Describe activities suitable for individual needs Identify ways to regularly incorporate activity into daily life Identify barriers to activity and ways to over come these barriers Identify diabetes medications being personally used and their primary action for lowering glucose and possible side effects Describe role of stress on blood glucose and develop strategies to address psychosocial issues Identify diabetes complications and ways to prevent them Explain how to manage diabetes during illness Evaluate success in meeting personal goal Establish 2-3 goals that they will plan to diligently work on  Goals:   I will be active 45 minutes or more 4 times a week  I will eat less unhealthy fats by eating less pork  I will test my glucose at least 1 times a day, 5 days a week   Your patient has identified these potential barriers to change:  Motivation Finances Stress Lack of Family Support  Your patient has identified their diabetes self-care support plan as  Family Education officer, environmental Resources [facility] Support Group  American Diabetes Association Website    Plan:  Attend Support Group as desired

## 2022-12-24 ENCOUNTER — Other Ambulatory Visit: Payer: Self-pay | Admitting: Sports Medicine

## 2023-01-03 ENCOUNTER — Telehealth: Payer: Self-pay | Admitting: Sports Medicine

## 2023-01-05 IMAGING — US US PELVIS LIMITED
1 series · 12 of 12 positions shown · non-contrast
Comparison: No prior.

CLINICAL DATA: Perineal mass.

EXAM:
ULTRASOUND OF THE PERINEUM

[Series 1: us pelvis limited (transabdominal only) · 12 acquisitions, 12 frames shown]
[im 1/12]
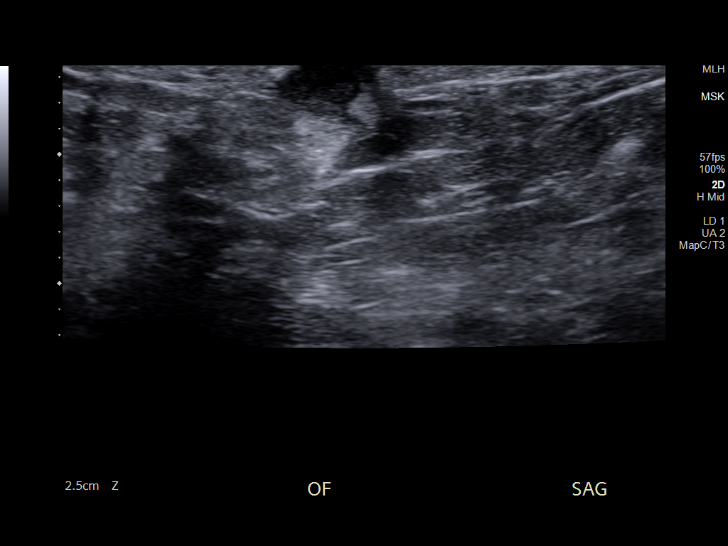
[im 2/12]
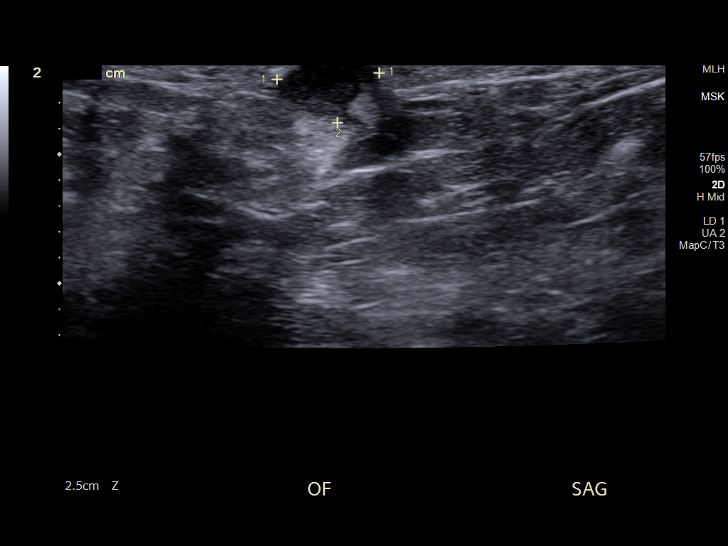
[im 3/12]
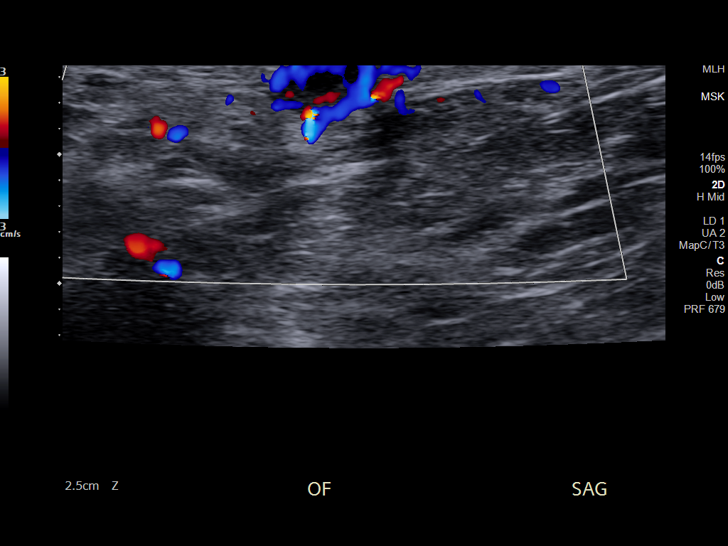
[im 4/12]
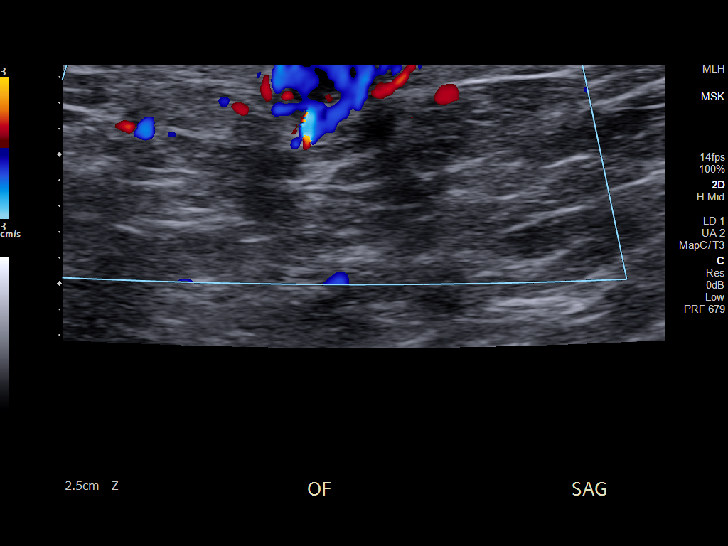
[im 5/12]
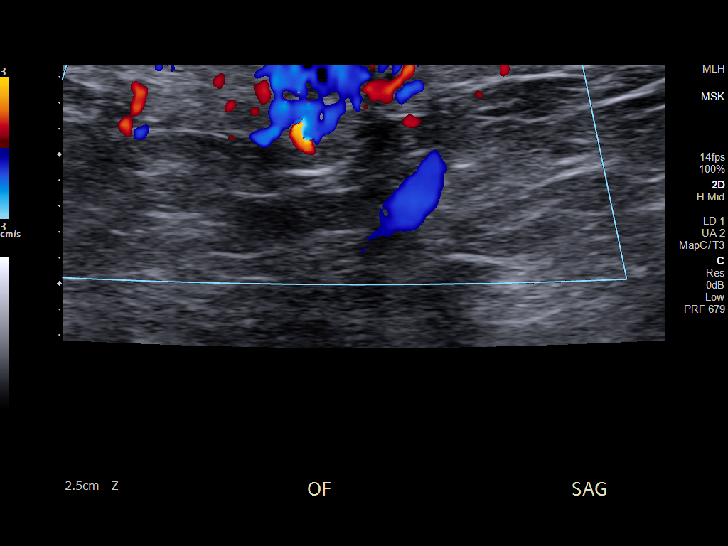
[im 6/12]
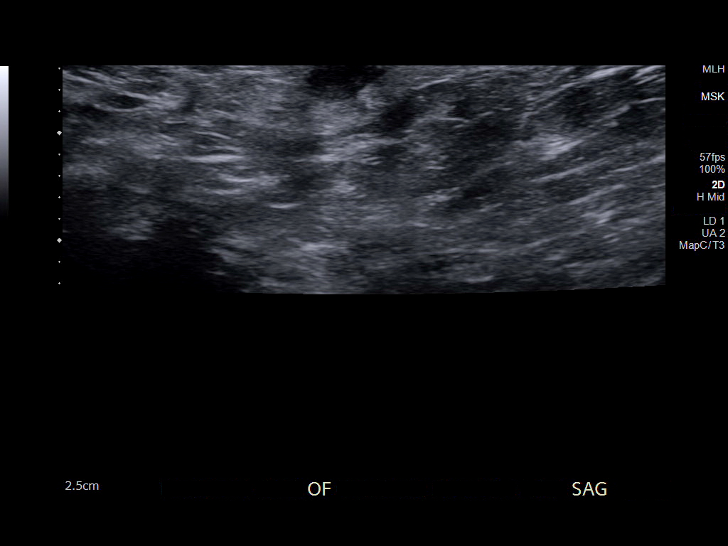
[im 7/12]
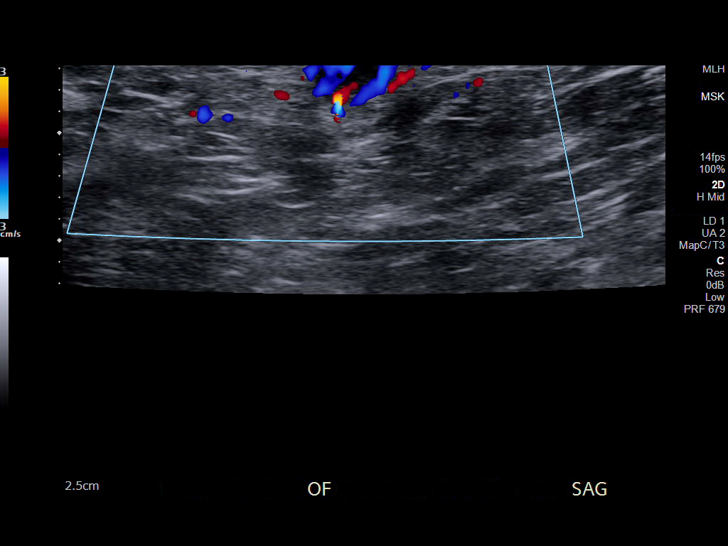
[im 8/12]
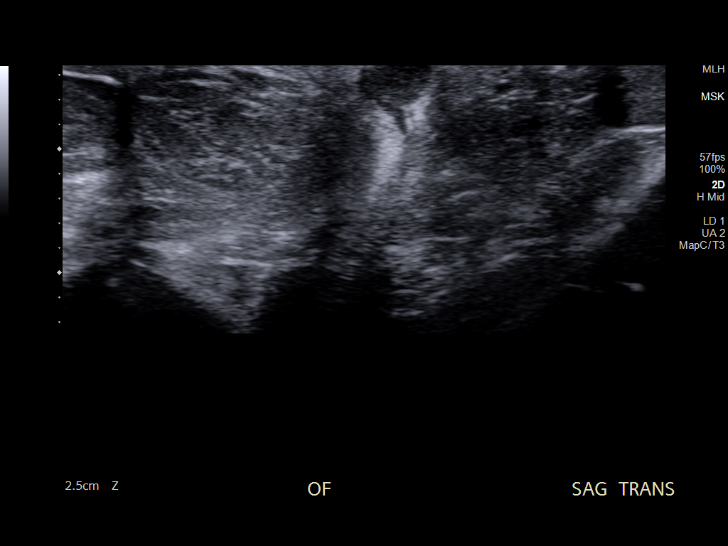
[im 9/12]
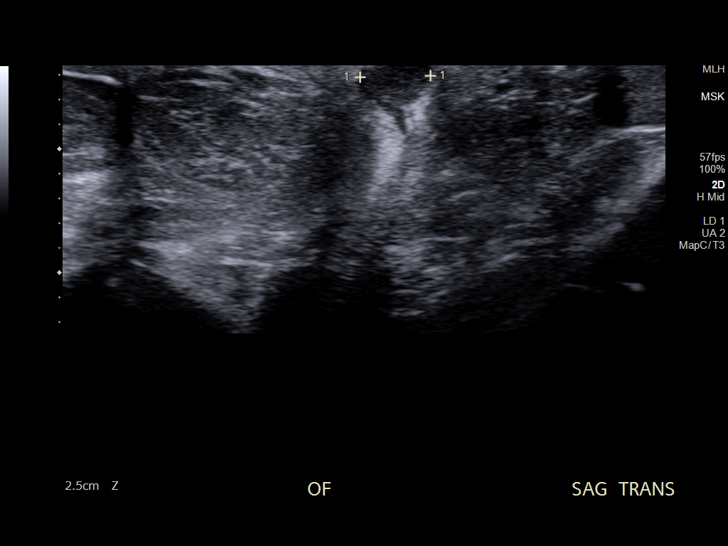
[im 10/12]
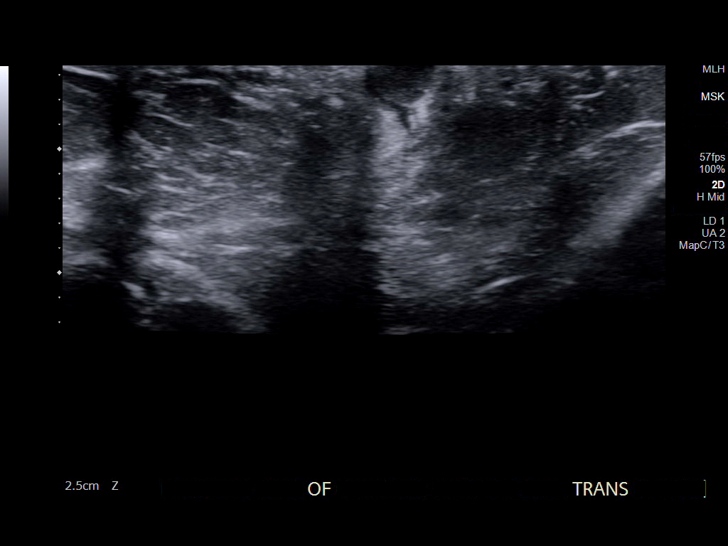
[im 11/12]
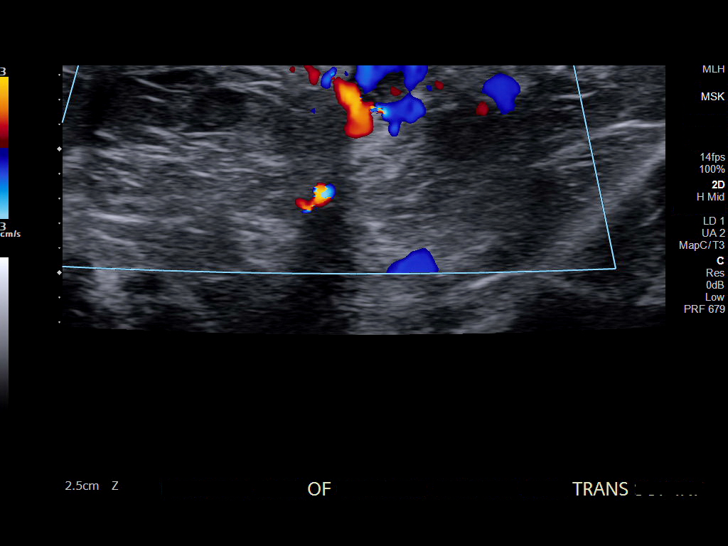
[im 12/12]
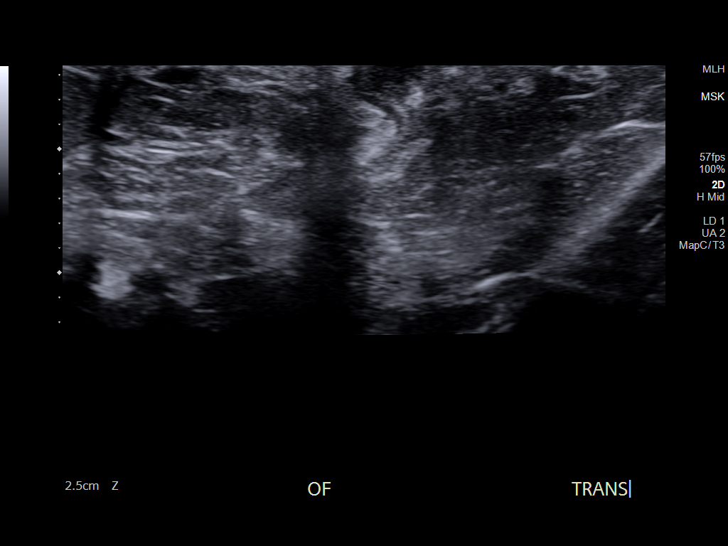

[12 of 12 positions shown; findings below may reference images not displayed]

FINDINGS: Ultrasound in the region of clinical concern performed. A [DATE] x
x 0.6 complex cystic mass is noted in the region of clinical
concern. Hypervascularity noted about the mass. A focal abscess
could present in this fashion. Tumor cannot be excluded. MRI of the
pelvis can be obtained for further evaluation if needed.
IMPRESSION: A 0.8 x 0.6 x 0.6 complex cystic mass with adjacent hypervascularity
noted the region clinical concern. Focal abscess could present this
fashion. Tumor cannot be excluded. MRI of the pelvis can be obtained
for further evaluation if needed.

## 2023-01-21 ENCOUNTER — Other Ambulatory Visit: Payer: Self-pay | Admitting: Sports Medicine

## 2023-01-27 ENCOUNTER — Ambulatory Visit: Payer: Medicare HMO | Admitting: Skilled Nursing Facility1

## 2023-02-10 ENCOUNTER — Encounter: Payer: Self-pay | Admitting: Sports Medicine

## 2023-02-10 ENCOUNTER — Ambulatory Visit (INDEPENDENT_AMBULATORY_CARE_PROVIDER_SITE_OTHER): Payer: Medicare HMO | Admitting: Sports Medicine

## 2023-02-10 VITALS — HR 65 | Ht 65.0 in | Wt 156.0 lb

## 2023-02-10 DIAGNOSIS — Z23 Encounter for immunization: Secondary | ICD-10-CM

## 2023-02-10 DIAGNOSIS — E119 Type 2 diabetes mellitus without complications: Secondary | ICD-10-CM | POA: Diagnosis not present

## 2023-02-10 LAB — POCT GLYCOSYLATED HEMOGLOBIN (HGB A1C): Hemoglobin A1C: 6 % — AB (ref 4.0–5.6)

## 2023-02-10 NOTE — Progress Notes (Signed)
    Procedures performed today:    None.  Independent interpretation of notes and tests performed by another provider:   None.  Brief History, Exam, Impression, and Recommendations:    Controlled type 2 diabetes mellitus without complication, without long-term current use of insulin (HCC) Exquisitely pleasant 72 year old male, hemoglobin A1c was 7.5%, down to 6.0% with diet and exercise, checking a urine microalbumin today. He is up-to-date on all other screening measures. Return to see me in 6 months.    ____________________________________________ [name]. Benjamin Stain, M.D., ABFM., CAQSM., AME. Primary Care and Sports Medicine [place] [facility]  Adjunct Professor of Family Medicine  [place] of [facility] of Medicine  Restaurant manager, fast food

## 2023-02-10 NOTE — Assessment & Plan Note (Signed)
Exquisitely pleasant 72 year old male, hemoglobin A1c was 7.5%, down to 6.0% with diet and exercise, checking a urine microalbumin today. He is up-to-date on all other screening measures. Return to see me in 6 months.

## 2023-02-24 ENCOUNTER — Ambulatory Visit: Payer: Medicare HMO | Admitting: Skilled Nursing Facility1

## 2023-03-03 ENCOUNTER — Ambulatory Visit: Payer: Medicare HMO | Admitting: Dietician

## 2023-06-20 ENCOUNTER — Other Ambulatory Visit: Payer: Self-pay | Admitting: Sports Medicine

## 2023-06-21 ENCOUNTER — Other Ambulatory Visit: Payer: Self-pay | Admitting: Sports Medicine

## 2023-08-16 ENCOUNTER — Other Ambulatory Visit: Payer: Self-pay | Admitting: Sports Medicine

## 2023-08-17 ENCOUNTER — Ambulatory Visit (INDEPENDENT_AMBULATORY_CARE_PROVIDER_SITE_OTHER): Payer: Medicare HMO | Admitting: Sports Medicine

## 2023-08-17 ENCOUNTER — Encounter: Payer: Self-pay | Admitting: Sports Medicine

## 2023-08-17 VITALS — BP 153/79 | HR 68 | Ht 65.0 in | Wt 156.0 lb

## 2023-08-17 DIAGNOSIS — I10 Essential (primary) hypertension: Secondary | ICD-10-CM | POA: Diagnosis not present

## 2023-08-17 DIAGNOSIS — E119 Type 2 diabetes mellitus without complications: Secondary | ICD-10-CM | POA: Diagnosis not present

## 2023-08-17 LAB — POCT GLYCOSYLATED HEMOGLOBIN (HGB A1C): Hemoglobin A1C: 5.9 % — AB (ref 4.0–5.6)

## 2023-08-17 NOTE — Progress Notes (Signed)
    Procedures performed today:    None.  Independent interpretation of notes and tests performed by another provider:   None.  Brief History, Exam, Impression, and Recommendations:    Controlled type 2 diabetes mellitus without complication, without long-term current use of insulin (HCC) Diet-controlled diabetes, A1c down to 5.9% from 6%. Ordering routine screenings.   Benign essential hypertension BP elevated, asymptomatic, we will switch to ambulatory monitoring.  I spent 30 minutes of total time managing this patient today, this includes chart review, face to face, and non-face to face time.  ____________________________________________ [name]. Benjamin Stain, M.D., ABFM., CAQSM., AME. Primary Care and Sports Medicine [place] [facility]  Adjunct Professor of Family Medicine  [place] [employer] [facility] of Medicine  Restaurant manager, fast food

## 2023-08-17 NOTE — Assessment & Plan Note (Signed)
 Diet-controlled diabetes, A1c down to 5.9% from 6%. Ordering routine screenings.

## 2023-08-17 NOTE — Assessment & Plan Note (Signed)
 BP elevated, asymptomatic, we will switch to ambulatory monitoring.

## 2023-08-19 LAB — COMPREHENSIVE METABOLIC PANEL
ALT: 18 IU/L (ref 0–44)
AST: 23 IU/L (ref 0–40)
Albumin: 4.5 g/dL (ref 3.8–4.8)
Alkaline Phosphatase: 78 IU/L (ref 44–121)
BUN/Creatinine Ratio: 13 (ref 10–24)
BUN: 12 mg/dL (ref 8–27)
Bilirubin Total: 0.6 mg/dL (ref 0.0–1.2)
CO2: 26 mmol/L (ref 20–29)
Calcium: 9.5 mg/dL (ref 8.6–10.2)
Chloride: 100 mmol/L (ref 96–106)
Creatinine, Ser: 0.9 mg/dL (ref 0.76–1.27)
Globulin, Total: 2.3 g/dL (ref 1.5–4.5)
Glucose: 102 mg/dL — ABNORMAL HIGH (ref 70–99)
Potassium: 4 mmol/L (ref 3.5–5.2)
Sodium: 142 mmol/L (ref 134–144)
Total Protein: 6.8 g/dL (ref 6.0–8.5)
eGFR: 90 mL/min/{1.73_m2} (ref >59)

## 2023-08-19 LAB — CBC
Hematocrit: 45 % (ref 37.5–51.0)
Hemoglobin: 15.2 g/dL (ref 13.0–17.7)
MCH: 32.3 pg (ref 26.6–33.0)
MCHC: 33.8 g/dL (ref 31.5–35.7)
MCV: 96 fL (ref 79–97)
Platelets: 178 10*3/uL (ref 150–450)
RBC: 4.7 x10E6/uL (ref 4.14–5.80)
RDW: 13.1 % (ref 11.6–15.4)
WBC: 6.6 10*3/uL (ref 3.4–10.8)

## 2023-08-19 LAB — MICROALBUMIN / CREATININE URINE RATIO
Creatinine, Urine: 60.9 mg/dL
Microalb/Creat Ratio: 12 mg/g{creat} (ref 0–29)
Microalbumin, Urine: 7.1 ug/mL

## 2023-08-19 LAB — LIPID PANEL
Chol/HDL Ratio: 2.5 ratio (ref 0.0–5.0)
Cholesterol, Total: 115 mg/dL (ref 100–199)
HDL: 46 mg/dL (ref >39)
LDL Chol Calc (NIH): 58 mg/dL (ref 0–99)
Triglycerides: 45 mg/dL (ref 0–149)
VLDL Cholesterol Cal: 11 mg/dL (ref 5–40)

## 2023-08-19 LAB — TSH: TSH: 2.3 u[IU]/mL (ref 0.450–4.500)

## 2023-08-21 ENCOUNTER — Encounter: Payer: Self-pay | Admitting: Sports Medicine

## 2023-08-21 DIAGNOSIS — I10 Essential (primary) hypertension: Secondary | ICD-10-CM

## 2023-08-22 MED ORDER — AMLODIPINE BESYLATE 10 MG PO TABS: 10.0000 mg | ORAL_TABLET | Freq: Every day | ORAL | 3 refills | Status: AC

## 2023-08-22 NOTE — Assessment & Plan Note (Signed)
 BP continues to be elevated in an ambulatory setting on 5 mg of amlodipine, increasing to amlodipine 10.

## 2023-10-04 ENCOUNTER — Ambulatory Visit: Payer: Self-pay

## 2023-10-04 NOTE — Telephone Encounter (Signed)
 Patient scheduled for appt tomorrow 10/05/23 with Dr. Sandy Crumb.

## 2023-10-04 NOTE — Telephone Encounter (Signed)
  Chief Complaint: Feet and tingling and also painful. Sore on right great toe Symptoms: above Frequency: over the weekend Pertinent Negatives: Patient denies  Disposition: [] ED /[] Urgent Care (no appt availability in office) / [x] Appointment(In office/virtual)/ []  [facility]/ [] Home Care/ [] Refused Recommended Disposition /[] [facility]/ []  Follow-up with PCP Additional Notes: Pt reports both tingling and pain. Tinglingling is intermittent. Also a sore on toe. Calltransferred to CAL to schedule appt.    Copied from CRM [id number]. Topic: Clinical - Red Word Triage >> Oct 04, 2023  8:30 AM Danelle Dunning F wrote: [facility] that prompted transfer to Nurse Triage: feet tingling; sore on the top of a toe on his right foot   Symptoms started over the weekend. Reason for Disposition  [1] Numbness (i.e., loss of sensation) of the face, arm / hand, or leg / foot on one side of the body AND [2] gradual onset (e.g., days to weeks) AND [3] present now  Answer Assessment - Initial Assessment Questions 1. SYMPTOM: "What is the main symptom you are concerned about?" (e.g., weakness, numbness)     Tingling, pain 2. ONSET: "When did this start?" (minutes, hours, days; while sleeping)     Over the weekend 3. LAST NORMAL: "When was the last time you (the patient) were normal (no symptoms)?"     1 week ago 4. PATTERN "Does this come and go, or has it been constant since it started?"  "Is it present now?"     Comes and goes 5. CARDIAC SYMPTOMS: "Have you had any of the following symptoms: chest pain, difficulty breathing, palpitations?"     no 6. NEUROLOGIC SYMPTOMS: "Have you had any of the following symptoms: headache, dizziness, vision loss, double vision, changes in speech, unsteady on your feet?"     no 7. OTHER SYMPTOMS: "Do you have any other symptoms?"     no  Protocols used: Neurologic Deficit-A-AH

## 2023-10-05 ENCOUNTER — Ambulatory Visit (INDEPENDENT_AMBULATORY_CARE_PROVIDER_SITE_OTHER): Admitting: Sports Medicine

## 2023-10-05 VITALS — BP 122/63 | HR 66

## 2023-10-05 DIAGNOSIS — B353 Tinea pedis: Secondary | ICD-10-CM | POA: Diagnosis not present

## 2023-10-05 DIAGNOSIS — M48061 Spinal stenosis, lumbar region without neurogenic claudication: Secondary | ICD-10-CM

## 2023-10-05 MED ORDER — CLOTRIMAZOLE 1 % EX LOTN: TOPICAL_LOTION | CUTANEOUS | 11 refills | Status: AC

## 2023-10-05 NOTE — Progress Notes (Signed)
    Procedures performed today:    None.  Independent interpretation of notes and tests performed by another provider:   None.  Brief History, Exam, Impression, and Recommendations:    Tinea pedis Slightly painful scaly rash plantar aspect both feet, nothing on the hands. Zeferino was using topical silver sulfadiazine, we will switch him to an antifungal cream, he will do this for a month and return, if insufficient improvement we will switch to oral terbinafine.  Lumbar spinal stenosis Having some numbness and tingling in the feet, no specific dermatomal distribution, known lumbar spinal stenosis historically well-controlled with chiropractor manipulation, we will add some home conditioning and he can continue manipulation, if insufficient improvement will consider Neurontin.    ____________________________________________ Joselyn Nicely. Sandy Crumb, M.D., ABFM., CAQSM., AME. Primary Care and Sports Medicine [facility]  Adjunct Professor of [facility] Medicine  [employer]  Restaurant manager, fast food

## 2023-10-05 NOTE — Assessment & Plan Note (Signed)
 Having some numbness and tingling in the feet, no specific dermatomal distribution, known lumbar spinal stenosis historically well-controlled with chiropractor manipulation, we will add some home conditioning and he can continue manipulation, if insufficient improvement will consider Neurontin.

## 2023-10-05 NOTE — Assessment & Plan Note (Signed)
 Slightly painful scaly rash plantar aspect both feet, nothing on the hands. Steven Moreno was using topical silver sulfadiazine, we will switch him to an antifungal cream, he will do this for a month and return, if insufficient improvement we will switch to oral terbinafine.

## 2023-11-03 ENCOUNTER — Ambulatory Visit (INDEPENDENT_AMBULATORY_CARE_PROVIDER_SITE_OTHER): Admitting: Sports Medicine

## 2023-11-03 DIAGNOSIS — B353 Tinea pedis: Secondary | ICD-10-CM | POA: Diagnosis not present

## 2023-11-03 MED ORDER — TERBINAFINE HCL 1 % EX CREA: TOPICAL_CREAM | CUTANEOUS | Status: AC

## 2023-11-03 MED ORDER — TERBINAFINE HCL 250 MG PO TABS: 250.0000 mg | ORAL_TABLET | Freq: Every day | ORAL | 0 refills | Status: AC

## 2023-11-03 NOTE — Assessment & Plan Note (Signed)
 Steven Moreno returns, his tinea rash on both feet is improving considerably, since he still has significant scaliness at the toes we will have him continue over-the-counter Lamisil twice daily and add oral terbinafine for 2 weeks.

## 2023-11-03 NOTE — Progress Notes (Signed)
    Procedures performed today:    None.  Independent interpretation of notes and tests performed by another provider:   None.  Brief History, Exam, Impression, and Recommendations:    Tinea pedis Aizen returns, his tinea rash on both feet is improving considerably, since he still has significant scaliness at the toes we will have him continue over-the-counter Lamisil twice daily and add oral terbinafine for 2 weeks.    ____________________________________________ Joselyn Nicely. Sandy Crumb, M.D., ABFM., CAQSM., AME. Primary Care and Sports Medicine [facility]  Adjunct Professor of [facility] Medicine  [employer]  Restaurant manager, fast food

## 2023-11-08 ENCOUNTER — Other Ambulatory Visit: Payer: Self-pay | Admitting: Sports Medicine

## 2023-11-27 ENCOUNTER — Other Ambulatory Visit: Payer: Self-pay | Admitting: Sports Medicine

## 2023-12-20 ENCOUNTER — Other Ambulatory Visit: Payer: Self-pay | Admitting: Sports Medicine

## 2024-01-19 ENCOUNTER — Ambulatory Visit (INDEPENDENT_AMBULATORY_CARE_PROVIDER_SITE_OTHER): Admitting: Sports Medicine

## 2024-01-19 VITALS — BP 127/75 | HR 69 | Temp 98.3°F | Ht 65.0 in | Wt 168.0 lb

## 2024-01-19 DIAGNOSIS — N400 Enlarged prostate without lower urinary tract symptoms: Secondary | ICD-10-CM | POA: Diagnosis not present

## 2024-01-19 DIAGNOSIS — R300 Dysuria: Secondary | ICD-10-CM | POA: Diagnosis not present

## 2024-01-19 LAB — POCT URINALYSIS DIP (CLINITEK)
Bilirubin, UA: NEGATIVE
Blood, UA: NEGATIVE
Glucose, UA: NEGATIVE mg/dL
Ketones, POC UA: NEGATIVE mg/dL
Leukocytes, UA: NEGATIVE
Nitrite, UA: NEGATIVE
POC PROTEIN,UA: NEGATIVE
Spec Grav, UA: 1.015 (ref 1.010–1.025)
Urobilinogen, UA: 1 U/dL
pH, UA: 7 (ref 5.0–8.0)

## 2024-01-19 MED ORDER — OXYBUTYNIN CHLORIDE 5 MG PO TABS: 5.0000 mg | ORAL_TABLET | Freq: Every day | ORAL | 11 refills | Status: DC

## 2024-01-19 MED ORDER — SULFAMETHOXAZOLE-TRIMETHOPRIM 800-160 MG PO TABS: 1.0000 | ORAL_TABLET | Freq: Two times a day (BID) | ORAL | 0 refills | Status: DC

## 2024-01-19 NOTE — Progress Notes (Signed)
    Procedures performed today:    None.  Independent interpretation of notes and tests performed by another provider:   None.  Brief History, Exam, Impression, and Recommendations:    BPH (benign prostatic hyperplasia) Pleasant 73 year old male, he has had several episodes of prostatitis in the past, we last saw him in 2023 for this, he responded well to a 2-week course of ciprofloxacin . He is currently taking finasteride and Flomax. More recently for the past she has had burning, urgency, frequency, he was seen in urgent care, treated with a week of Septra , he is on day 2 or 3. This is starting to help, urinalysis is normal today. Abdominal exam is normal today. I think we should add an additional week due to the suspicion of prostatitis. This will take him out 14 total days of antibiotic. Considering symptomatology I am going to add low-dose oxybutynin  to take at night as he is waking up at least every 2 hours to pee.    ____________________________________________ Debby PARAS. Curtis, M.D., ABFM., CAQSM., AME. Primary Care and Sports Medicine [facility]  Adjunct Professor of [facility] Medicine  [employer]  Restaurant manager, fast food

## 2024-01-19 NOTE — Assessment & Plan Note (Signed)
 Pleasant 73 year old male, he has had several episodes of prostatitis in the past, we last saw him in 2023 for this, he responded well to a 2-week course of ciprofloxacin . He is currently taking finasteride and Flomax. More recently for the past she has had burning, urgency, frequency, he was seen in urgent care, treated with a week of Septra , he is on day 2 or 3. This is starting to help, urinalysis is normal today. Abdominal exam is normal today. I think we should add an additional week due to the suspicion of prostatitis. This will take him out 14 total days of antibiotic. Considering symptomatology I am going to add low-dose oxybutynin  to take at night as he is waking up at least every 2 hours to pee.

## 2024-02-14 ENCOUNTER — Encounter: Payer: Self-pay | Admitting: Sports Medicine

## 2024-02-15 ENCOUNTER — Ambulatory Visit: Admitting: Sports Medicine

## 2024-03-09 ENCOUNTER — Encounter: Payer: Self-pay | Admitting: Medical-Surgical

## 2024-03-09 ENCOUNTER — Ambulatory Visit (INDEPENDENT_AMBULATORY_CARE_PROVIDER_SITE_OTHER): Admitting: Medical-Surgical

## 2024-03-09 ENCOUNTER — Ambulatory Visit (INDEPENDENT_AMBULATORY_CARE_PROVIDER_SITE_OTHER)

## 2024-03-09 VITALS — BP 137/67 | HR 60 | Resp 20 | Ht 65.0 in | Wt 164.0 lb

## 2024-03-09 DIAGNOSIS — S99922A Unspecified injury of left foot, initial encounter: Secondary | ICD-10-CM | POA: Diagnosis not present

## 2024-03-09 DIAGNOSIS — S99822A Other specified injuries of left foot, initial encounter: Secondary | ICD-10-CM | POA: Diagnosis not present

## 2024-03-09 NOTE — Progress Notes (Signed)
        Established patient visit   History of Present Illness   Discussed the use of AI scribe software for clinical note transcription with the patient, who gave verbal consent to proceed.  History of Present Illness   Steven Moreno is a 73 year old male who presents with a left great toe injury after dropping a piece of slate on it.  Left great toe trauma - Sustained injury to left great toe six days ago after dropping a piece of slate on it - Immediate onset of pain and discoloration - Toenail turned blue the afternoon of the injury - Redness developed two days after injury - No swelling or broken skin - No use of Tylenol, ibuprofen, ice, antibiotics, or other treatments  Pain characteristics - Initial pain intensity rated 8/10, currently 2/10 - Pain aggravated by prolonged walking, especially during part-time work involving significant ambulation - No pain when at home and off his feet  Local inflammatory signs - Redness present but has not worsened or spread - No swelling - No open wounds or drainage       Physical Exam   Physical Exam Vitals and nursing note reviewed.  Constitutional:      General: He is not in acute distress.    Appearance: Normal appearance.  HENT:     Head: Normocephalic and atraumatic.  Cardiovascular:     Rate and Rhythm: Normal rate.  Pulmonary:     Effort: Pulmonary effort is normal. No respiratory distress.  Musculoskeletal:     Comments: Left great toe swelling with erythema to the proximal nail border and a subungal hematoma affecting the entire nail.   Skin:    General: Skin is warm and dry.  Neurological:     Mental Status: He is alert and oriented to person, place, and time.  Psychiatric:        Mood and Affect: Mood normal.        Behavior: Behavior normal.        Thought Content: Thought content normal.        Judgment: Judgment normal.    Assessment & Plan     Contusion of left great toe with subungual hematoma Mild  pain persists, stable redness, low cellulitis risk. Possible toenail loss. Rule out fracture. - Order x-ray of left great toe to rule out fracture. - Advise ice application to reduce swelling and pain. - Will likely lose the entire toenail.  - Recommend firm sole shoes to alleviate walking pain. - Instruct to monitor for increased redness or swelling and report if these occur. - Discuss potential use of post-op shoe if pain persists with walking. - Advise to send a message or photo if symptoms worsen for possible empiric antibiotic treatment.     Follow up   Return if symptoms worsen or fail to improve. __________________________________ Zada FREDRIK Palin, DNP, APRN, FNP-BC Primary Care and Sports Medicine [facility]

## 2024-03-15 ENCOUNTER — Ambulatory Visit: Payer: Self-pay | Admitting: Medical-Surgical

## 2024-04-12 ENCOUNTER — Emergency Department (HOSPITAL_BASED_OUTPATIENT_CLINIC_OR_DEPARTMENT_OTHER)
Admission: EM | Admit: 2024-04-12 | Discharge: 2024-04-12 | Disposition: A | Discharge status: Home / Self Care | Attending: Emergency Medicine | Admitting: Emergency Medicine

## 2024-04-12 ENCOUNTER — Encounter (HOSPITAL_BASED_OUTPATIENT_CLINIC_OR_DEPARTMENT_OTHER): Payer: Self-pay

## 2024-04-12 ENCOUNTER — Emergency Department (HOSPITAL_BASED_OUTPATIENT_CLINIC_OR_DEPARTMENT_OTHER)

## 2024-04-12 ENCOUNTER — Other Ambulatory Visit: Payer: Self-pay

## 2024-04-12 DIAGNOSIS — R600 Localized edema: Secondary | ICD-10-CM | POA: Diagnosis not present

## 2024-04-12 DIAGNOSIS — R2242 Localized swelling, mass and lump, left lower limb: Secondary | ICD-10-CM | POA: Diagnosis present

## 2024-04-12 DIAGNOSIS — I10 Essential (primary) hypertension: Secondary | ICD-10-CM | POA: Insufficient documentation

## 2024-04-12 DIAGNOSIS — J45909 Unspecified asthma, uncomplicated: Secondary | ICD-10-CM | POA: Diagnosis not present

## 2024-04-12 NOTE — ED Provider Notes (Signed)
 Emergency Department Provider Note   I have reviewed the triage vital signs and the nursing notes.   HISTORY  Chief Complaint Leg Swelling   HPI Steven Moreno is a 73 y.o. male presents to the ED with leg/foot swelling. Symptoms ongoing for the last 3 weeks. No injury. No rash. No CP or SOB.     Past Medical History:  Diagnosis Date   Allergy    Arthritis    Asthma    as child- out grew this    Blood transfusion without reported diagnosis    1985 with ulcer   Cataract    right eye   Constipation    metamucil helps   Environmental allergies    GERD (gastroesophageal reflux disease)    Hypertension    Ulcer    1985    Review of Systems  Constitutional: No fever/chills Cardiovascular: Denies chest pain. Respiratory: Denies shortness of breath. Gastrointestinal: No abdominal pain.  No nausea, no vomiting.  Musculoskeletal: Negative for back pain. Right foot swelling.  Skin: Negative for rash. Neurological: Negative for headaches.  ____________________________________________   PHYSICAL EXAM:  VITAL SIGNS: ED Triage Vitals  Encounter Vitals Group     BP 04/12/24 1123 (!) 148/91     Pulse Rate 04/12/24 1123 71     Resp 04/12/24 1123 16     Temp 04/12/24 1123 97.7 F (36.5 C)     Temp Source 04/12/24 1123 Oral     SpO2 04/12/24 1123 98 %   Constitutional: Alert and oriented. Well appearing and in no acute distress. Eyes: Conjunctivae are normal.  Head: Atraumatic. Nose: No congestion/rhinnorhea. Mouth/Throat: Mucous membranes are moist.  Neck: No stridor Cardiovascular: Normal rate, regular rhythm. Good peripheral circulation. Grossly normal heart sounds.   Respiratory: Normal respiratory effort.  No retractions. Lungs CTAB. Gastrointestinal: Soft and nontender. No distention.  Musculoskeletal: No lower extremity tenderness nor edema. No gross deformities of extremities. Neurologic:  Normal speech and language.  Skin:  Skin is warm, dry and intact.  No rash noted.  ____________________________________________  RADIOLOGY  US  Venous Img Lower Unilateral Left Result Date: 04/12/2024 EXAM: ULTRASOUND DUPLEX OF THE LEFT LOWER EXTREMITY VEINS CLINICAL HISTORY: Leg redness, swelling, aching. TECHNIQUE: Duplex ultrasound using B-mode/gray scaled imaging and Doppler spectral analysis and color flow was obtained of the deep venous structures of the left lower extremity. COMPARISON: None. FINDINGS: The common femoral vein, femoral vein, popliteal vein, and posterior tibial vein of the left lower extremity demonstrate normal compressibility with normal color flow and spectral analysis. IMPRESSION: 1. No evidence of DVT. Electronically signed by: Evalene Coho MD 04/12/2024 12:34 PM EDT RP Workstation: [id number]    ____________________________________________   PROCEDURES  Procedure(s) performed:   Procedures  None ____________________________________________   INITIAL IMPRESSION / ASSESSMENT AND PLAN / ED COURSE  Pertinent labs & imaging results that were available during my care of the patient were reviewed by me and considered in my medical decision making (see chart for details).   This patient is Presenting for Evaluation of foot swelling/leg pain, which does require a range of treatment options, and is a complaint that involves a moderate risk of morbidity and mortality.  The Differential Diagnoses include MSK pain, DVT, fracture, contusion, etc.  Radiologic Tests Ordered, included DVT US . I independently interpreted the images and agree with radiology interpretation.   Medical Decision Making: Summary:  Patient presents to the ED with complaint of foot swelling. Legs/feet are grossly the same size on exam but DVT  US  obtain and negative. Plan for continued supportive care and PCP follow up. Stable for discharge.    Patient's presentation is most consistent with acute, uncomplicated illness.   Disposition:  discharge  ____________________________________________  FINAL CLINICAL IMPRESSION(S) / ED DIAGNOSES  Final diagnoses:  Leg edema    Note:  This document was prepared using Dragon voice recognition software and may include unintentional dictation errors.  Fonda Law, MD, [facility]    Mariusz Jubb, Fonda MATSU, MD 04/15/24 [phone]

## 2024-04-12 NOTE — ED Triage Notes (Signed)
 L foot/calf swelling, redness and aching in L calf for 3 weeks. Denies chest pain or SHOB. L foot cap refill under 3 sec, pedal pulse 2+  Also notes swelling in R foot, none noted upon assessment

## 2024-04-12 NOTE — Discharge Instructions (Signed)
 Please follow closely with your primary care doctor.  I have listed the name of a primary care here in the building you can call for follow-up if needed.  Return with any new or suddenly worsening symptoms such as worsening swelling, chest pain, shortness of breath, etc.

## 2024-04-20 DIAGNOSIS — J3089 Other allergic rhinitis: Secondary | ICD-10-CM | POA: Insufficient documentation

## 2024-04-20 DIAGNOSIS — H1045 Other chronic allergic conjunctivitis: Secondary | ICD-10-CM | POA: Insufficient documentation

## 2024-04-23 ENCOUNTER — Encounter: Payer: Self-pay | Admitting: Sports Medicine

## 2024-04-23 ENCOUNTER — Ambulatory Visit (INDEPENDENT_AMBULATORY_CARE_PROVIDER_SITE_OTHER): Admitting: Sports Medicine

## 2024-04-23 VITALS — BP 149/76 | Ht 65.0 in | Wt 168.8 lb

## 2024-04-23 DIAGNOSIS — I10 Essential (primary) hypertension: Secondary | ICD-10-CM

## 2024-04-23 DIAGNOSIS — M25572 Pain in left ankle and joints of left foot: Secondary | ICD-10-CM

## 2024-04-23 DIAGNOSIS — E119 Type 2 diabetes mellitus without complications: Secondary | ICD-10-CM

## 2024-04-23 DIAGNOSIS — N400 Enlarged prostate without lower urinary tract symptoms: Secondary | ICD-10-CM

## 2024-04-23 DIAGNOSIS — R7303 Prediabetes: Secondary | ICD-10-CM

## 2024-04-23 LAB — POCT GLYCOSYLATED HEMOGLOBIN (HGB A1C)
HbA1c POC (<> result, manual entry): 6 % (ref 4.0–5.6)
HbA1c, POC (controlled diabetic range): 6 % (ref 0.0–7.0)
HbA1c, POC (prediabetic range): 6 % (ref 5.7–6.4)
Hemoglobin A1C: 6 % — AB (ref 4.0–5.6)

## 2024-04-23 NOTE — Progress Notes (Addendum)
 New Patient Office Visit  Subjective    Patient ID: Steven Moreno, male    DOB: [birth year]  Age: 73 y.o. MRN: [id number]  CC:  Chief Complaint  Patient presents with   Establish Care    New pt. [facility] [place]. Pt had a foot injury  a few months ago and has been having issues with swelling in his legs and feet. He was seen at the ED on 10/30    HPI   Steven Moreno is a 73 year old male who presents to establish care c/o left foot pain and swelling following trauma.  In late September, he dropped a piece of slate on his left foot, which initially caused pain. An x-ray of the toe taken on October 7th showed no fracture. About a week later, he began experiencing pain and swelling in the foot, which gradually increased. A week ago, he visited [facility] , where an ultrasound was performed to rule out a blood clot, which was negative. Although the swelling has reduced, he continues to experience pain in the foot, now extending to the ankle, especially when walking. He rates the pain as a 6 or 7 out of 10 when walking and notes that it occurs with pressure on the foot. He also reports occasional tingling in the foot since the injury.   No chest pain, shortness of breath, or palpitations. He reports frequent urination, especially at night, for which he is on proscar. He denies any urinary pain or burning, and has no history of heart problems, stroke, or recent ulcers. He had an ulcer in 1985 but no issues since. He had a colonoscopy in May 2022 and he is due for another in 2027.  He lives alone and is able to manage daily activities independently. He works part-time at   which involves a lot of walking. He does not engage in regular muscle strengthening exercises. He denies memory issues or mood disturbances. He does not smoke or drink alcohol.  His blood pressure is usually well-controlled, but he notes it was elevated today. His A1c is 6,   He has a  history of elevated PSA levels and is under the care of a urologist, with no history of prostate cancer. He denies any blood in the urine or stool, and his appetite is good   Outpatient Encounter Medications as of 04/23/2024  Medication Sig   amLODipine  (NORVASC ) 10 MG tablet Take 1 tablet (10 mg total) by mouth daily.   Apremilast  (OTEZLA ) 10 & 20 & 30 MG TBPK 10 mg p.o. day 1, then twice daily day 2, then 10 mg in the morning and 20 mg in the evening day 3, then 20 mg twice daily day 4, then 20 mg in the morning and 30 mg in the evening day 5, then 30 mg twice daily.   Clotrimazole  1 % LOTN Apply liberally to the feet 3 times daily   tamsulosin (FLOMAX) 0.4 MG CAPS capsule Take 0.4 mg by mouth.   terbinafine  (LAMISIL ) 1 % cream Apply to affected area BID until rash gone, then apply 2 more weeks.   valsartan -hydrochlorothiazide  (DIOVAN -HCT) 320-25 MG tablet TAKE 1 TABLET BY MOUTH EVERY DAY   Bepotastine Besilate 1.5 % SOLN SMARTSIG:In Eye(s)   desoximetasone (TOPICORT) 0.25 % cream APPLY TO AFFECTED AREA TWICE A DAY   ELOCON  0.1 % cream Apply 1 application. topically daily.   finasteride (PROSCAR) 5 MG tablet Take 5 mg by mouth  daily.   Fluocinolone  Acetonide 0.01 % OIL Place 5 drops in ear(s) 2 (two) times daily as needed.   magnesium  oxide (MAG-OX) 400 MG tablet TAKE 2 TABLETS (800 MG TOTAL) BY MOUTH AT BEDTIME.   omeprazole (PRILOSEC) 40 MG capsule Take 20 mg by mouth once a week.    oxybutynin  (DITROPAN ) 5 MG tablet Take 1-2 tablets (5-10 mg total) by mouth at bedtime.   Potassium Chloride  ER 20 MEQ TBCR TAKE 1 TABLET BY MOUTH TWICE A DAY   sulfamethoxazole -trimethoprim  (BACTRIM  DS) 800-160 MG tablet Take 1 tablet by mouth 2 (two) times daily for 7 days. (Patient not taking: Reported on 04/23/2024)   Facility-Administered Encounter Medications as of 04/23/2024  Medication   0.9 %  sodium chloride  infusion    Past Medical History:  Diagnosis Date   Allergy    Arthritis    Asthma     as child- out grew this    Blood transfusion without reported diagnosis    1985 with ulcer   Cataract    right eye   Constipation    metamucil helps   Environmental allergies    GERD (gastroesophageal reflux disease)    Hypertension    Ulcer    1985    Past Surgical History:  Procedure Laterality Date   CATARACT EXTRACTION Right    COLONOSCOPY     POLYPECTOMY      Family History  Problem Relation Age of Onset   Colon cancer Maternal Grandfather 35   Stomach cancer Neg Hx    Colon polyps Neg Hx    Rectal cancer Neg Hx     Social History   Socioeconomic History   Marital status: Single    Spouse name: Not on file   Number of children: 0   Years of education: 14   Highest education level: Associate degree: academic program  Occupational History   Occupation: Hospital Doctor    Comment: Works part time  Tobacco Use   Smoking status: Never   Smokeless tobacco: Never  Substance and Sexual Activity   Alcohol use: No    Alcohol/week: 0.0 standard drinks of alcohol   Drug use: No   Sexual activity: Not on file  Other Topics Concern   Not on file  Social History Narrative   Lives alone. He is working part-time as hospital doctor. He enjoys biking in his free time.    Social Drivers of Corporate Investment Banker Strain: Low Risk  (12/31/2020)   Overall Financial Resource Strain (CARDIA)    Difficulty of Paying Living Expenses: Not hard at all  Food Insecurity: No Food Insecurity (12/31/2020)   Hunger Vital Sign    Worried About Running Out of Food in the Last Year: Never true    Ran Out of Food in the Last Year: Never true  Transportation Needs: No Transportation Needs (12/31/2020)   PRAPARE - Administrator, Civil Service (Medical): No    Lack of Transportation (Non-Medical): No  Physical Activity: Inactive (12/31/2020)   Exercise Vital Sign    Days of Exercise per Week: 0 days    Minutes of Exercise per Session: 0 min  Stress: No Stress Concern Present  (12/31/2020)   [employer] of Occupational Health - Occupational Stress Questionnaire    Feeling of Stress : Not at all  Social Connections: Unknown (10/24/2021)   Received from [facility]   Social Network    Social Network: Not on file  Intimate Partner Violence: Unknown (09/15/2021)  Received from [facility]   HITS    Physically Hurt: Not on file    Insult or Talk Down To: Not on file    Threaten Physical Harm: Not on file    Scream or Curse: Not on file    Review of Systems  Constitutional:  Negative for chills and fever.  HENT:  Negative for congestion and sore throat.   Respiratory:  Negative for cough, sputum production and shortness of breath.   Cardiovascular:  Negative for chest pain, palpitations and leg swelling.  Gastrointestinal:  Negative for abdominal pain, heartburn and nausea.  Genitourinary:  Negative for dysuria, frequency and hematuria.  Musculoskeletal:  Negative for falls and myalgias.       Left great toe pain   Neurological:  Negative for dizziness, sensory change and focal weakness.        Objective    There were no vitals taken for this visit.  Physical Exam Constitutional:      Appearance: Normal appearance.  HENT:     Head: Normocephalic and atraumatic.  Cardiovascular:     Rate and Rhythm: Normal rate and regular rhythm.     Pulses: Normal pulses.     Heart sounds: Normal heart sounds.  Pulmonary:     Effort: No respiratory distress.     Breath sounds: No stridor. No wheezing or rales.  Abdominal:     General: Bowel sounds are normal. There is no distension.     Palpations: Abdomen is soft.     Tenderness: There is no abdominal tenderness. There is no guarding.  Musculoskeletal:        General: No swelling.  Neurological:     Mental Status: He is alert. Mental status is at baseline.     Motor: No weakness.         Assessment & Plan:   Problem List Items Addressed This Visit   None 1. Acute left ankle pain  (Primary) No tenderness on palpation  No redness noted Mild pitting edema left leg  Rom intact    2. Benign essential hypertension High today  Previous readings at goal Instructed patient to monitor bp and keep a log Avoid salty foods Dash diet  Cont with amlodipine , vlasartan, hctz  3.  Prediabetes A1c 6  Avoid high carbohydrate foods Exercise regularly  4. Benign prostatic hyperplasia, unspecified whether lower urinary tract symptoms present Stable Cont with proscar

## 2024-05-14 ENCOUNTER — Ambulatory Visit (INDEPENDENT_AMBULATORY_CARE_PROVIDER_SITE_OTHER)

## 2024-05-14 ENCOUNTER — Ambulatory Visit: Admitting: Podiatry

## 2024-05-14 ENCOUNTER — Encounter: Payer: Self-pay | Admitting: Podiatry

## 2024-05-14 DIAGNOSIS — S92302A Fracture of unspecified metatarsal bone(s), left foot, initial encounter for closed fracture: Secondary | ICD-10-CM | POA: Diagnosis not present

## 2024-05-14 DIAGNOSIS — S9032XA Contusion of left foot, initial encounter: Secondary | ICD-10-CM | POA: Diagnosis not present

## 2024-05-14 NOTE — Progress Notes (Signed)
   Chief Complaint  Patient presents with   Foot Injury    Forefoot/midfoot/lateral foot left - dropped a steel beam on foot back in September 2025, ongoing treatment with PCP-xrayed, ultrasound to check for clot (neg fx and DVT), continues to have swelling and pain, taking Tylenol for pain control   New Patient (Initial Visit)    HPI: 73 y.o. male presenting today as a new patient for evaluation of pain and tenderness associated to the left foot  Brief history: Initial injury September 2025 when he dropped a piece of slate landscaping rock on his left foot.  Initially he had significant pain and tenderness to the left great toe and the pain has seemed to settle in the midfoot.  He also experienced lateral column pain likely secondary to compensation.  Past Medical History:  Diagnosis Date   Allergy    Arthritis    Asthma    as child- out grew this    Blood transfusion without reported diagnosis    1985 with ulcer   Cataract    right eye   Constipation    metamucil helps   Environmental allergies    GERD (gastroesophageal reflux disease)    Hypertension    Ulcer    1985    Past Surgical History:  Procedure Laterality Date   CATARACT EXTRACTION Right    COLONOSCOPY     POLYPECTOMY      Allergies  Allergen Reactions   Aspirin     Stomach pain, gi bleeding     Physical Exam: General: The patient is alert and oriented x3 in no acute distress.  Dermatology: Skin is warm, dry and supple bilateral lower extremities.   Vascular: Palpable pedal pulses bilaterally. Capillary refill within normal limits.  No appreciable edema.  No erythema.  Neurological: Grossly intact via light touch  Musculoskeletal Exam: No gross pedal deformities noted.  Tenderness throughout palpation to the dorsal midfoot as well as the lateral column of the foot  Radiographic Exam LT foot 05/14/2024:  Normal osseous mineralization.  Degenerative changes noted with subtle irregularities throughout  the second third TMT of the left foot possibly consistent with history of subacute fracture to this area  Assessment/Plan of Care: 1.  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  Concern for possible history of fracture left foot.  Initial injury September 2025  -Patient evaluated.  X-rays reviewed -Order placed for CT scan left foot -Prescription for meloxicam 15 mg daily -Recommend good supportive tennis shoes and sneakers.  In the meantime continue conservative care management -Return to clinic after CT scan to review and discuss further treatment options     Thresa EMERSON Sar, DPM [facility]  Dr. Thresa EMERSON Sar, DPM    [address] [place], [place] [address]                Office [phone]  Fax [phone]

## 2024-05-16 ENCOUNTER — Other Ambulatory Visit: Payer: Self-pay | Admitting: Podiatry

## 2024-05-16 ENCOUNTER — Other Ambulatory Visit: Payer: Self-pay | Admitting: Lab

## 2024-05-16 MED ORDER — MELOXICAM 15 MG PO TABS: 15.0000 mg | ORAL_TABLET | Freq: Every day | ORAL | 1 refills | Status: DC

## 2024-05-17 ENCOUNTER — Inpatient Hospital Stay: Admission: RE | Admit: 2024-05-17 | Discharge: 2024-05-17 | Attending: Podiatry

## 2024-05-17 DIAGNOSIS — S92302A Fracture of unspecified metatarsal bone(s), left foot, initial encounter for closed fracture: Secondary | ICD-10-CM

## 2024-05-22 ENCOUNTER — Other Ambulatory Visit: Payer: Self-pay | Admitting: Sports Medicine

## 2024-05-22 NOTE — Telephone Encounter (Unsigned)
 Copied from CRM #[id number]. Topic: Clinical - Medication Refill >> May 22, 2024  8:28 AM Carlyon D wrote: Medication:  finasteride (PROSCAR) 5 MG tablet    Potassium Chloride  ER 20 MEQ TBCR    Has the patient contacted their pharmacy? Yes (Agent: If no, request that the patient contact the pharmacy for the refill. If patient does not wish to contact the pharmacy document the reason why and proceed with request.) (Agent: If yes, when and what did the pharmacy advise?)  This is the patient's preferred pharmacy:  [id number] [place], [place] - [address] [place] [address] Phone: [phone] Fax: [phone]  Is this the correct pharmacy for this prescription? Yes If no, delete pharmacy and type the correct one.   Has the prescription been filled recently? No  Is the patient out of the medication? Not out of the potassium but the other he only has the rest of the week left   Has the patient been seen for an appointment in the last year OR does the patient have an upcoming appointment? Yes  Can we respond through MyChart? Yes  Agent: Please be advised that Rx refills may take up to 3 business days. We ask that you follow-up with your pharmacy.

## 2024-05-22 NOTE — Telephone Encounter (Signed)
 Patient will be following you

## 2024-05-23 MED ORDER — FINASTERIDE 5 MG PO TABS: 5.0000 mg | ORAL_TABLET | Freq: Every day | ORAL | 0 refills | Status: AC

## 2024-05-23 MED ORDER — POTASSIUM CHLORIDE ER 20 MEQ PO TBCR: 1.0000 | EXTENDED_RELEASE_TABLET | Freq: Two times a day (BID) | ORAL | 1 refills | Status: AC

## 2024-05-30 ENCOUNTER — Ambulatory Visit: Admitting: Podiatry

## 2024-05-30 ENCOUNTER — Encounter: Payer: Self-pay | Admitting: Podiatry

## 2024-05-30 VITALS — Ht 65.0 in | Wt 168.8 lb

## 2024-05-30 DIAGNOSIS — M19072 Primary osteoarthritis, left ankle and foot: Secondary | ICD-10-CM

## 2024-05-30 NOTE — Progress Notes (Signed)
" ° °  Chief Complaint  Patient presents with   Foot Injury    Pt is here to discuss recent CT results and discuss next options.    HPI: 73 y.o. male presenting today for follow-up evaluation of pain and tenderness associated to the left foot. CT ordered last visit  Brief history: Initial injury September 2025 when he dropped a piece of slate landscaping rock on his left foot.  Initially he had significant pain and tenderness to the left great toe and the pain has seemed to settle in the midfoot.  He also experienced lateral column pain likely secondary to compensation.  Past Medical History:  Diagnosis Date   Allergy    Arthritis    Asthma    as child- out grew this    Blood transfusion without reported diagnosis    1985 with ulcer   Cataract    right eye   Constipation    metamucil helps   Environmental allergies    GERD (gastroesophageal reflux disease)    Hypertension    Ulcer    1985    Past Surgical History:  Procedure Laterality Date   CATARACT EXTRACTION Right    COLONOSCOPY     POLYPECTOMY      Allergies  Allergen Reactions   Aspirin     Stomach pain, gi bleeding     Physical Exam: General: The patient is alert and oriented x3 in no acute distress.  Dermatology: Skin is warm, dry and supple bilateral lower extremities.   Vascular: Palpable pedal pulses bilaterally. Capillary refill within normal limits.  No appreciable edema.  No erythema.  Neurological: Grossly intact via light touch  Musculoskeletal Exam: No gross pedal deformities noted.  Tenderness throughout palpation to the dorsal midfoot as well as the lateral column of the foot  Radiographic Exam LT foot 05/14/2024:  Normal osseous mineralization.  Degenerative changes noted with subtle irregularities throughout the second third TMT of the left foot possibly consistent with history of subacute fracture to this area  CT FOOT LEFT WO CONTRAST (Accession [id number]) (Order [id number]) Imaging Date:  05/17/2024 Department: [facility] AT [address] Released By: Jakie Milling Authorizing: Janit Thresa HERO, DPM  IMPRESSION: 1.  No acute osseous injury of the left foot. 2. 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 3. Mild osteoarthritis of the third, fourth, and fifth TMT joint. 4. Hallux valgus and moderate osteoarthritis of the first MTP joint. 5. 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  Assessment/Plan of Care: 1.  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  Concern for possible history of fracture left foot.  Initial injury September 2025  -Patient evaluated.   CT reviewed -Continue meloxicam  15 mg daily - Injection of 0.5 cc Celestone  Soluspan injected into the left midfoot -Continue wearing good supportive tennis shoes and sneakers -Follow-up PRN     Thresa EMERSON Janit, DPM [facility]  Dr. Thresa EMERSON Janit, DPM    [address] [place], [place] [address]                Office [phone]  Fax [phone]     "

## 2024-06-06 ENCOUNTER — Other Ambulatory Visit: Payer: Self-pay | Admitting: Sports Medicine

## 2024-06-06 NOTE — Telephone Encounter (Signed)
 Refill requested for Valsartan . Pharmacy is asking if changes is needed

## 2024-06-12 DIAGNOSIS — M19072 Primary osteoarthritis, left ankle and foot: Secondary | ICD-10-CM | POA: Diagnosis not present

## 2024-06-12 MED ORDER — BETAMETHASONE SOD PHOS & ACET 6 (3-3) MG/ML IJ SUSP
3.0000 mg | Freq: Once | INTRAMUSCULAR | Status: AC
  Administered 2024-06-12: 3 mg via INTRA_ARTICULAR

## 2024-07-13 ENCOUNTER — Other Ambulatory Visit: Payer: Self-pay | Admitting: Podiatry

## 2024-07-20 ENCOUNTER — Telehealth: Payer: Self-pay

## 2024-07-20 ENCOUNTER — Ambulatory Visit: Payer: Self-pay

## 2024-07-20 ENCOUNTER — Encounter: Payer: Self-pay | Admitting: Sports Medicine

## 2024-07-20 ENCOUNTER — Ambulatory Visit: Admitting: Sports Medicine

## 2024-07-20 VITALS — BP 138/77 | HR 67 | Temp 98.0°F | Wt 175.8 lb

## 2024-07-20 DIAGNOSIS — J302 Other seasonal allergic rhinitis: Secondary | ICD-10-CM

## 2024-07-20 DIAGNOSIS — H9201 Otalgia, right ear: Secondary | ICD-10-CM

## 2024-07-20 DIAGNOSIS — L4050 Arthropathic psoriasis, unspecified: Secondary | ICD-10-CM

## 2024-07-20 DIAGNOSIS — I152 Hypertension secondary to endocrine disorders: Secondary | ICD-10-CM | POA: Insufficient documentation

## 2024-07-20 DIAGNOSIS — I10 Essential (primary) hypertension: Secondary | ICD-10-CM

## 2024-07-20 MED ORDER — CIPROFLOXACIN-HYDROCORTISONE 0.2-1 % OT SUSP: 3.0000 [drp] | Freq: Two times a day (BID) | OTIC | 0 refills | Status: AC

## 2024-07-20 NOTE — Telephone Encounter (Signed)
 Copied from CRM [id number]. Topic: Clinical - Prescription Issue >> Jul 20, 2024  2:19 PM Viola F wrote: Reason for CRM: Rollo from [facility] called regarding the Ciprofloxacin -hydrocortisone  (CIPRO  HC) OTIC suspension that was sent to the pharmacy today - it's not covered by insurance and needs an alternative sent over. Her call back number is [phone]

## 2024-07-20 NOTE — Assessment & Plan Note (Signed)
 SABRA

## 2024-07-20 NOTE — Telephone Encounter (Signed)
 FYI Only or Action Required?: FYI only for provider: appointment scheduled on 07/20/24.  Patient was last seen in primary care on 04/23/2024 by [name], MD.  Called Nurse Triage reporting Otalgia.  Symptoms began several days ago.  Interventions attempted: OTC medications: Flonase and Other: nasal irrigation.  Symptoms are: unchanged.  Triage Disposition: See Physician Within 24 Hours  Patient/caregiver understands and will follow disposition?: Yes                                  1. LOCATION: Which ear is involved?     Right ear  2. ONSET: When did the ear pain start?      Monday of this week 3. SEVERITY: How bad is the pain?  (Scale 1-10; mild, moderate or severe)     Intermittent, rates pain a 6 when present 5. FEVER: Do you have a fever? If Yes, ask: What is your temperature, how was it measured, and when did it start?     Denies 6. CAUSE: Have you been swimming recently?, How often do you use Q-TIPS?, Have you had any recent air travel or scuba diving?     Unsure, but stated he has frequent sinus problems 7. OTHER SYMPTOMS: Do you have any other symptoms? (e.g., decreased hearing, dizziness, headache, stiff neck, vomiting)     Pressure in right ear Denies: loss of hearing, dizziness, redness/swelling, dishchare/blood from ear  Patient went to UC on Tuesday of this week. Per patient, provider did not see anything wrong and advised use of nasal irrigation and Flonase. Patient denied improvement after intervention. Patient scheduled in office today with PCP.   Reason for Disposition  Earache  (Exceptions: Brief ear pain of lasting less than 60 minutes, or earache occurring during air travel.)  Protocols used: Earache-A-AH   Reason for Triage: Pain in right ear, scale pain is a 6

## 2024-07-20 NOTE — Progress Notes (Unsigned)
 "  Careteam: Patient Care Team: Steven Madden, MD as PCP - General (Internal Medicine) Steven Mulders, MD as Referring Physician (Ophthalmology) Steven Norleen SAILOR, MD as Consulting Physician (Gastroenterology)  Allergies[1]  Chief Complaint  Patient presents with   Ear Pain    Pt has been having right ear pain that started on Monday. Pt denies any other symptoms. He was seen at urgent care and was told it may be fluid.    Discussed the use of AI scribe software for clinical note transcription with the patient, who gave verbal consent to proceed.  History of Present Illness      Review of Systems:  Review of Systems  Constitutional:  Negative for chills and fever.  HENT:  Positive for ear pain. Negative for congestion, ear discharge, sinus pain, sore throat and tinnitus.   Eyes:  Negative for blurred vision.  Respiratory:  Negative for cough and shortness of breath.   Cardiovascular:  Negative for chest pain and palpitations.  Gastrointestinal:  Negative for abdominal pain, blood in stool, diarrhea, heartburn, nausea and vomiting.  Genitourinary:  Negative for dysuria and hematuria.  Musculoskeletal:  Negative for myalgias.  Neurological:  Positive for headaches. Negative for dizziness.   Negative unless indicated in HPI.   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   Benign essential hypertension 03/14/2019   BPH (benign prostatic hyperplasia)  03/14/2019   Annual physical exam 03/14/2019   Past Medical History:  Diagnosis Date   Allergy    Arthritis    Asthma    as child- out grew this    Blood transfusion without reported diagnosis    1985 with ulcer   Cataract    right eye   Constipation    metamucil helps   Environmental allergies    GERD (gastroesophageal reflux disease)    Hypertension    Ulcer    1985   Past Surgical History:  Procedure Laterality Date   CATARACT EXTRACTION Right    COLONOSCOPY     POLYPECTOMY     Social History[2] Family History  Problem Relation Age of Onset   Cancer Mother    Heart disease Mother    Hearing loss Father    Heart attack Maternal Grandmother    Cancer Maternal Grandfather    Colon cancer Maternal Grandfather 14   Cancer Paternal Grandfather    Stomach cancer Neg Hx    Colon polyps Neg Hx    Rectal cancer Neg Hx    Allergies[3]  Medications: Patient's Medications  New Prescriptions   No medications on file  Previous Medications   AMLODIPINE  (NORVASC ) 10 MG TABLET    Take 1 tablet (10 mg total) by mouth daily.   APREMILAST  (OTEZLA ) 10 & 20 & 30 MG TBPK    10 mg p.o. day 1, then twice daily day 2, then 10 mg in the morning and 20 mg in the evening day 3, then 20 mg twice daily day 4, then 20 mg in the morning and 30  mg in the evening day 5, then 30 mg twice daily.   BEPOTASTINE BESILATE 1.5 % SOLN    SMARTSIG:In Eye(s)   CLOTRIMAZOLE  1 % LOTN    Apply liberally to the feet 3 times daily   DESOXIMETASONE (TOPICORT) 0.25 % CREAM    APPLY TO AFFECTED AREA TWICE A DAY   ELOCON  0.1 % CREAM    Apply 1 application. topically daily.   FINASTERIDE  (PROSCAR ) 5 MG TABLET    Take 1 tablet (5 mg total) by mouth daily.   FLUOCINOLONE  ACETONIDE 0.01 % OIL    Place 5 drops in ear(s) 2 (two) times daily as needed.   MAGNESIUM  OXIDE (MAG-OX) 400 MG TABLET    TAKE 2 TABLETS (800 MG TOTAL) BY MOUTH AT BEDTIME.   MELOXICAM  (MOBIC ) 15 MG TABLET    TAKE 1 TABLET (15 MG TOTAL) BY MOUTH  DAILY.   OMEPRAZOLE (PRILOSEC) 40 MG CAPSULE    Take 20 mg by mouth once a week.    POTASSIUM CHLORIDE  ER 20 MEQ TBCR    Take 1 tablet (20 mEq total) by mouth 2 (two) times daily.   TAMSULOSIN (FLOMAX) 0.4 MG CAPS CAPSULE    Take 0.4 mg by mouth.   TERBINAFINE  (LAMISIL ) 1 % CREAM    Apply to affected area BID until rash gone, then apply 2 more weeks.   VALSARTAN -HYDROCHLOROTHIAZIDE  (DIOVAN -HCT) 320-25 MG TABLET    TAKE 1 TABLET BY MOUTH EVERY DAY  Modified Medications   No medications on file  Discontinued Medications   No medications on file    Physical Exam: Vitals:   07/20/24 1316  BP: (!) 147/89  Pulse: 67  Temp: 98 F (36.7 C)  TempSrc: Oral  SpO2: 95%  Weight: 175 lb 12.8 oz (79.7 kg)   Body mass index is 29.25 kg/m. BP Readings from Last 3 Encounters:  07/20/24 (!) 147/89  04/23/24 (!) 149/76  04/12/24 (!) 148/91   Wt Readings from Last 3 Encounters:  07/20/24 175 lb 12.8 oz (79.7 kg)  05/30/24 168 lb 12.8 oz (76.6 kg)  04/23/24 168 lb 12.8 oz (76.6 kg)    Physical Exam Constitutional:      Appearance: Normal appearance.  HENT:     Head: Normocephalic and atraumatic.     Ears:     Comments: Rt TM - no redness, no bulging Cloudy  Cardiovascular:     Rate and Rhythm: Normal rate and regular rhythm.     Pulses: Normal pulses.     Heart sounds: Normal heart sounds.  Pulmonary:     Effort: No respiratory distress.     Breath sounds: No stridor. No wheezing or rales.  Abdominal:     General: Bowel sounds are normal. There is no distension.     Palpations: Abdomen is soft.     Tenderness: There is no abdominal tenderness. There is no guarding.  Musculoskeletal:        General: No swelling.  Neurological:     Mental Status: He is alert. Mental status is at baseline.     Sensory: No sensory deficit.     Motor: No weakness.     Labs reviewed: Basic Metabolic Panel: Recent Labs    08/18/23 0809  NA 142  K 4.0  CL 100  CO2 26  GLUCOSE 102*  BUN 12   CREATININE 0.90  CALCIUM 9.5  TSH 2.300   Liver Function Tests: Recent Labs    08/18/23 0809  AST 23  ALT 18  ALKPHOS 78  BILITOT 0.6  PROT 6.8  ALBUMIN 4.5   No results for input(s): LIPASE, AMYLASE in the last 8760 hours. No results for input(s): AMMONIA in the last 8760 hours. CBC: Recent Labs    08/18/23 0809  WBC 6.6  HGB 15.2  HCT 45.0  MCV 96  PLT 178   Lipid Panel: Recent Labs    08/18/23 0809  CHOL 115  HDL 46  LDLCALC 58  TRIG 45  CHOLHDL 2.5   TSH: Recent Labs    08/18/23 0809  TSH 2.300   A1C: Lab Results  Component Value Date   HGBA1C 6.0 (A) 04/23/2024   HGBA1C 6.0 04/23/2024   HGBA1C 6.0 04/23/2024   HGBA1C 6.0 04/23/2024    Assessment & Plan Right ear pain  Orders:   ciprofloxacin -hydrocortisone  (CIPRO  HC) OTIC suspension; Place 3 drops into the right ear 2 (two) times daily.  Primary hypertension     Hypertension associated with diabetes (HCC)     Psoriatic arthritis (HCC)     Seasonal allergies      No follow-ups on file.:   Steven Moreno    [1]  Allergies Allergen Reactions   Aspirin     Stomach pain, gi bleeding  [2]  Social History Tobacco Use   Smoking status: Never   Smokeless tobacco: Never  Substance Use Topics   Alcohol use: No    Alcohol/week: 0.0 standard drinks of alcohol   Drug use: No  [3]  Allergies Allergen Reactions   Aspirin     Stomach pain, gi bleeding   "

## 2024-07-20 NOTE — Telephone Encounter (Signed)
 Spoke with pts pharmacy and she said Neomycin polymyxin hydrocortisone  can be sent in and it would be $21 with the discount code

## 2024-07-20 NOTE — Telephone Encounter (Signed)
 Pt has scheduled appt with provider in office today

## 2024-08-22 ENCOUNTER — Ambulatory Visit: Admitting: Sports Medicine

## 2024-08-29 ENCOUNTER — Ambulatory Visit
